# Patient Record
Sex: Male | Born: 1980 | Race: Black or African American | Hispanic: No | Smoking: Former smoker
Health system: Southern US, Community
[De-identification: ages and names within clinical notes are randomized; demographics above are authoritative.]

## PROBLEM LIST (undated history)

## (undated) ENCOUNTER — Emergency Department (HOSPITAL_COMMUNITY): Discharge status: Against Medical Advice | Payer: No Typology Code available for payment source

## (undated) DIAGNOSIS — I10 Essential (primary) hypertension: Secondary | ICD-10-CM

## (undated) DIAGNOSIS — E785 Hyperlipidemia, unspecified: Secondary | ICD-10-CM

## (undated) DIAGNOSIS — J45909 Unspecified asthma, uncomplicated: Secondary | ICD-10-CM

## (undated) DIAGNOSIS — K219 Gastro-esophageal reflux disease without esophagitis: Secondary | ICD-10-CM

## (undated) HISTORY — PX: UPPER GASTROINTESTINAL ENDOSCOPY: SHX188

## (undated) HISTORY — PX: FRACTURE SURGERY: SHX138

## (undated) SURGERY — Surgical Case
Anesthesia: *Unknown

---

## 2010-09-02 ENCOUNTER — Emergency Department (HOSPITAL_COMMUNITY)
Admission: EM | Admit: 2010-09-02 | Discharge: 2010-09-02 | Payer: Self-pay | Source: Home / Self Care | Admitting: Emergency Medicine

## 2010-12-14 ENCOUNTER — Emergency Department (HOSPITAL_COMMUNITY): Payer: Self-pay

## 2010-12-14 ENCOUNTER — Emergency Department (HOSPITAL_COMMUNITY)
Admission: EM | Admit: 2010-12-14 | Discharge: 2010-12-14 | Disposition: A | Discharge status: Home / Self Care | Payer: Self-pay | Attending: Emergency Medicine | Admitting: Emergency Medicine

## 2010-12-14 DIAGNOSIS — I10 Essential (primary) hypertension: Secondary | ICD-10-CM | POA: Insufficient documentation

## 2010-12-14 DIAGNOSIS — E785 Hyperlipidemia, unspecified: Secondary | ICD-10-CM | POA: Insufficient documentation

## 2010-12-14 DIAGNOSIS — M25519 Pain in unspecified shoulder: Secondary | ICD-10-CM | POA: Insufficient documentation

## 2010-12-14 DIAGNOSIS — IMO0001 Reserved for inherently not codable concepts without codable children: Secondary | ICD-10-CM | POA: Insufficient documentation

## 2010-12-14 DIAGNOSIS — R071 Chest pain on breathing: Secondary | ICD-10-CM | POA: Insufficient documentation

## 2011-01-21 ENCOUNTER — Emergency Department (HOSPITAL_COMMUNITY)
Admission: EM | Admit: 2011-01-21 | Discharge: 2011-01-21 | Disposition: A | Discharge status: Home / Self Care | Payer: Self-pay | Attending: Emergency Medicine | Admitting: Emergency Medicine

## 2011-01-21 DIAGNOSIS — I10 Essential (primary) hypertension: Secondary | ICD-10-CM | POA: Insufficient documentation

## 2011-01-21 DIAGNOSIS — E785 Hyperlipidemia, unspecified: Secondary | ICD-10-CM | POA: Insufficient documentation

## 2011-01-21 DIAGNOSIS — H9209 Otalgia, unspecified ear: Secondary | ICD-10-CM | POA: Insufficient documentation

## 2011-01-21 DIAGNOSIS — H6690 Otitis media, unspecified, unspecified ear: Secondary | ICD-10-CM | POA: Insufficient documentation

## 2011-04-20 ENCOUNTER — Emergency Department (HOSPITAL_COMMUNITY)
Admission: EM | Admit: 2011-04-20 | Discharge: 2011-04-21 | Disposition: A | Discharge status: Home / Self Care | Payer: Self-pay | Attending: Emergency Medicine | Admitting: Emergency Medicine

## 2011-04-20 DIAGNOSIS — IMO0002 Reserved for concepts with insufficient information to code with codable children: Secondary | ICD-10-CM | POA: Insufficient documentation

## 2011-04-20 DIAGNOSIS — H9210 Otorrhea, unspecified ear: Secondary | ICD-10-CM | POA: Insufficient documentation

## 2011-04-20 DIAGNOSIS — T169XXA Foreign body in ear, unspecified ear, initial encounter: Secondary | ICD-10-CM | POA: Insufficient documentation

## 2011-04-20 DIAGNOSIS — E785 Hyperlipidemia, unspecified: Secondary | ICD-10-CM | POA: Insufficient documentation

## 2011-04-20 DIAGNOSIS — I10 Essential (primary) hypertension: Secondary | ICD-10-CM | POA: Insufficient documentation

## 2011-09-16 ENCOUNTER — Emergency Department (HOSPITAL_COMMUNITY)
Admission: EM | Admit: 2011-09-16 | Discharge: 2011-09-16 | Disposition: A | Discharge status: Home / Self Care | Payer: Self-pay | Attending: Emergency Medicine | Admitting: Emergency Medicine

## 2011-09-16 ENCOUNTER — Encounter (HOSPITAL_COMMUNITY): Payer: Self-pay | Admitting: Emergency Medicine

## 2011-09-16 DIAGNOSIS — N63 Unspecified lump in unspecified breast: Secondary | ICD-10-CM | POA: Insufficient documentation

## 2011-09-16 DIAGNOSIS — N6320 Unspecified lump in the left breast, unspecified quadrant: Secondary | ICD-10-CM

## 2011-09-16 HISTORY — DX: Hyperlipidemia, unspecified: E78.5

## 2011-09-16 HISTORY — DX: Essential (primary) hypertension: I10

## 2011-09-16 NOTE — ED Notes (Signed)
Pt reports mass in his L breast since July.  Reports pain and swelling comes and goes.

## 2011-09-16 NOTE — ED Provider Notes (Signed)
History     CSN: [id number]  Arrival date & time 09/16/11  1650   First MD Initiated Contact with Patient 09/16/11 1815      Chief Complaint  Patient presents with  . Breast Mass    (Consider location/radiation/quality/duration/timing/severity/associated sxs/prior treatment) HPI Comments: Patient reports small mass in left breast that he first noticed last June (8 months ago).  States the mass gets bigger and smaller, sometimes is tender.  Denies fever, unexplained weight loss or gain, night sweats, or any recent illness.  Pt does not have a PCP.    The history is provided by the patient.    Past Medical History  Diagnosis Date  . Hypertension   . Hyperlipemia     Past Surgical History  Procedure Date  . Fracture surgery     No family history on file.  History  Substance Use Topics  . Smoking status: Current Everyday Smoker  . Smokeless tobacco: Not on file  . Alcohol Use: No      Review of Systems  All other systems reviewed and are negative.    Allergies  Review of patient's allergies indicates no known allergies.  Home Medications  No current outpatient prescriptions on file.  BP 156/95  Pulse 70  Temp(Src) 98.5 F (36.9 C) (Oral)  Resp 18  SpO2 99%  Physical Exam  Nursing note and vitals reviewed. Constitutional: He is oriented to person, place, and time. He appears well-developed and well-nourished.  HENT:  Head: Normocephalic and atraumatic.  Neck: Neck supple.  Pulmonary/Chest: Effort normal. Left breast exhibits mass. Left breast exhibits no nipple discharge and no skin change. Breasts are symmetrical.    Neurological: He is alert and oriented to person, place, and time.    ED Course  Procedures (including critical care time)  Labs Reviewed - No data to display No results found.   1. Mass of left breast       MDM  Patient with small mass in left breast that he found 8 months ago.  Lump changes in size and is intermittently  tender, it is mobile.  I discussed with him that these are generally good signs, but that men do develop breast cancer.  Patient verbalizes understanding and agrees he would like further evaluation.   I discussed proper follow up with Dr Roselyn Bering who recommends PCP or general surgery for follow up.  I have given resources and referral to patient, who verbalizes understanding and agrees with plan.          Dillard Cannon [place], [place] 09/17/11 [phone]

## 2011-09-16 NOTE — ED Notes (Signed)
Pt noticed a mass in left breast in June of last year. "Sometimes it gets bigger sometimes it gets smaller" Pt reports it does not hurt today and is small today but came to ED because others were expressing concerns that "I didn't get it checked out yet."

## 2011-09-19 NOTE — ED Provider Notes (Signed)
Medical screening examination/treatment/procedure(s) were performed by non-physician practitioner and as supervising physician I was immediately available for consultation/collaboration.   Page Lancon R Trindon Dorton, MD 09/19/11 0834 

## 2011-09-27 ENCOUNTER — Ambulatory Visit (INDEPENDENT_AMBULATORY_CARE_PROVIDER_SITE_OTHER): Payer: Self-pay | Admitting: General Surgery

## 2011-10-05 ENCOUNTER — Other Ambulatory Visit: Payer: Self-pay

## 2011-10-05 ENCOUNTER — Emergency Department (HOSPITAL_COMMUNITY): Payer: Self-pay

## 2011-10-05 ENCOUNTER — Encounter (HOSPITAL_COMMUNITY): Payer: Self-pay | Admitting: *Deleted

## 2011-10-05 ENCOUNTER — Emergency Department (HOSPITAL_COMMUNITY)
Admission: EM | Admit: 2011-10-05 | Discharge: 2011-10-05 | Disposition: A | Discharge status: Home / Self Care | Payer: Self-pay | Attending: Emergency Medicine | Admitting: Emergency Medicine

## 2011-10-05 ENCOUNTER — Emergency Department (INDEPENDENT_AMBULATORY_CARE_PROVIDER_SITE_OTHER)
Admission: EM | Admit: 2011-10-05 | Discharge: 2011-10-05 | Disposition: A | Discharge status: Nursing Home (Includes ICF) | Payer: Self-pay | Source: Home / Self Care | Attending: Emergency Medicine | Admitting: Emergency Medicine

## 2011-10-05 DIAGNOSIS — E785 Hyperlipidemia, unspecified: Secondary | ICD-10-CM | POA: Insufficient documentation

## 2011-10-05 DIAGNOSIS — R202 Paresthesia of skin: Secondary | ICD-10-CM

## 2011-10-05 DIAGNOSIS — R7309 Other abnormal glucose: Secondary | ICD-10-CM | POA: Insufficient documentation

## 2011-10-05 DIAGNOSIS — I10 Essential (primary) hypertension: Secondary | ICD-10-CM | POA: Insufficient documentation

## 2011-10-05 DIAGNOSIS — R739 Hyperglycemia, unspecified: Secondary | ICD-10-CM

## 2011-10-05 DIAGNOSIS — R209 Unspecified disturbances of skin sensation: Secondary | ICD-10-CM

## 2011-10-05 LAB — POCT I-STAT, CHEM 8
BUN: 13 mg/dL (ref 6–23)
Calcium, Ion: 1.26 mmol/L (ref 1.12–1.32)
Chloride: 105 mEq/L (ref 96–112)
Creatinine, Ser: 1.2 mg/dL (ref 0.50–1.35)
Glucose, Bld: 159 mg/dL — ABNORMAL HIGH (ref 70–99)

## 2011-10-05 NOTE — ED Provider Notes (Signed)
History     CSN: [id number]  Arrival date & time 10/05/11  1352   First MD Initiated Contact with Patient 10/05/11 1402      No chief complaint on file.   (Consider location/radiation/quality/duration/timing/severity/associated sxs/prior treatment) HPI Comments: For about 3-4 weeks, the Left side of my body goes numb and feel tingling" My face to my Left upper arm and left leg, sometimes with cramping pains on my leg" " At time feels like my balance its off"  Patient is a 31 y.o. male presenting with neurologic complaint. The history is provided by the patient.  Neurologic Problem The primary symptoms include dizziness, paresthesias and loss of sensation. Primary symptoms do not include headaches, loss of consciousness, seizures, visual change, focal weakness, memory loss, fever, nausea or vomiting. The symptoms are waxing and waning. The neurological symptoms are focal.  Dizziness does not occur with nausea, vomiting, weakness or diaphoresis.  Additional symptoms do not include neck stiffness, weakness, pain or photophobia. Medical issues also include hypertension. Medical issues do not include seizures or cerebral vascular accident.    Past Medical History  Diagnosis Date  . Hypertension   . Hyperlipemia     Past Surgical History  Procedure Date  . Fracture surgery     No family history on file.  History  Substance Use Topics  . Smoking status: Current Everyday Smoker  . Smokeless tobacco: Not on file  . Alcohol Use: No      Review of Systems  Constitutional: Negative for fever, diaphoresis, activity change and appetite change.  HENT: Negative for neck stiffness.   Eyes: Negative for photophobia and visual disturbance.  Respiratory: Negative for shortness of breath and stridor.   Gastrointestinal: Negative for nausea and vomiting.  Neurological: Positive for dizziness, numbness and paresthesias. Negative for focal weakness, seizures, loss of consciousness, syncope,  speech difficulty, weakness and headaches.  Psychiatric/Behavioral: Negative for memory loss.    Allergies  Review of patient's allergies indicates no known allergies.  Home Medications  No current outpatient prescriptions on file.  BP 138/83  Pulse 71  Temp(Src) 98.3 F (36.8 C) (Oral)  Resp 20  SpO2 96%  Physical Exam  Nursing note and vitals reviewed. Constitutional: He is oriented to person, place, and time. He appears well-developed and well-nourished. No distress.  HENT:  Head: Normocephalic.  Eyes: Conjunctivae are normal. No scleral icterus.  Neck: Neck supple. No JVD present.  Cardiovascular: Normal rate and regular rhythm.  Exam reveals no friction rub.   Musculoskeletal: Normal range of motion.  Neurological: He is alert and oriented to person, place, and time. He displays no atrophy and no tremor. No cranial nerve deficit or sensory deficit. He exhibits normal muscle tone. Coordination normal.  Skin: Skin is warm.    ED Course  Procedures (including critical care time)  Labs Reviewed - No data to display No results found.   1. Paresthesias/numbness   2. Paresthesia of left arm and leg       MDM  Neurological symptoms- x 3 weeks. No focal neurological signs.Transfered to the ED for further evaluation- also relates disequilibrium intermittent         Jimmie Molly, MD 10/05/11 1427

## 2011-10-05 NOTE — ED Notes (Addendum)
Patient reports he had numbness in his left side 2 weeks ago.  He had return of sx today.  Patient with no noted weakness.  He was sent from [facility] for further eval.  Patient states he woke and felt like he was drooling this morning.  He also reports intermittently he has feeling that everything is numb on the left side

## 2011-10-05 NOTE — ED Notes (Signed)
Ambulatory with no verbal complaints at this time

## 2011-10-05 NOTE — ED Provider Notes (Signed)
History     CSN: [id number]  Arrival date & time 10/05/11  1459   First MD Initiated Contact with Patient 10/05/11 2011      Chief Complaint  Patient presents with  . Numbness   HPI: The history is provided by the patient.  Pt reports that approx 2 weeks ago he had intermittent numbness to his left side that lasted most of a day then resolved. Today he awoke and felt like he was "drooling" and had intermittent numbness to (L) side through-out the day at work. He left work to come for eval as he was concerned about being at work w/ these symptoms. States today he has also had intermittent numbness to (L) side of his face as well. Pertinent negatives include no CP, SOB, weakness, H/A, slurred speech, fever, facial pain, visual disturbances, neck pain or other associated symptoms. Denies recent illnesses.  Past Medical History  Diagnosis Date  . Hypertension   . Hyperlipemia     Past Surgical History  Procedure Date  . Fracture surgery     Family History  Problem Relation Age of Onset  . Liver disease Mother   . Coronary artery disease Father     History  Substance Use Topics  . Smoking status: Current Everyday Smoker  . Smokeless tobacco: Not on file  . Alcohol Use: No      Review of Systems  Constitutional: Negative.   HENT: Negative.   Eyes: Negative.   Respiratory: Negative.   Cardiovascular: Negative.   Gastrointestinal: Negative.   Genitourinary: Negative.   Musculoskeletal: Negative.   Skin: Negative.   Neurological: Negative.   Hematological: Negative.   Psychiatric/Behavioral: Negative.     Allergies  Review of patient's allergies indicates no known allergies.  Home Medications  No current outpatient prescriptions on file.  BP 146/96  Pulse 87  Temp(Src) 98.1 F (36.7 C) (Oral)  Resp 18  SpO2 100%  Physical Exam  Constitutional: He is oriented to person, place, and time. He appears well-developed and well-nourished.  HENT:  Head:  Normocephalic and atraumatic.  Eyes: Conjunctivae and EOM are normal. Pupils are equal, round, and reactive to light.  Neck: Normal range of motion. Neck supple.  Cardiovascular: Normal rate and regular rhythm.   Pulmonary/Chest: Effort normal and breath sounds normal.  Abdominal: Soft. Bowel sounds are normal.  Musculoskeletal: Normal range of motion.  Neurological: He is alert and oriented to person, place, and time. He has normal strength and normal reflexes. No cranial nerve deficit or sensory deficit. He displays a negative Romberg sign. Coordination normal. GCS eye subscore is 4. GCS verbal subscore is 5. GCS motor subscore is 6.       No objective facial droop.  Skin: Skin is warm and dry.  Psychiatric: He has a normal mood and affect.    ED Course  Procedures Results of Ct and PE discussed. Discussed need for f/u w/ neurology if symptoms continue. Also discussed slightly elevated serum glucose and need for f/u w/ PCP for re-eval for same. Will plan for d/c home w/ neuro and PCP referrals. Pt agreeable w/ plan.  Labs Reviewed  POCT I-STAT, CHEM 8 - Abnormal; Notable for the following:    Glucose, Bld 159 (*)    All other components within normal limits   Ct Head Wo Contrast  10/05/2011  *RADIOLOGY REPORT*  Clinical Data: Left side numbness  CT HEAD WITHOUT CONTRAST  Technique:  Contiguous axial images were obtained from the base of the  skull through the vertex without contrast.  Comparison: None  Findings: Normal ventricular morphology. No midline shift or mass effect. Normal appearance of brain parenchyma. No intracranial hemorrhage, mass lesion, or acute infarction. Visualized paranasal sinuses and mastoid air cells clear. Bones unremarkable.  IMPRESSION: No acute intracranial abnormalities.  Original Report Authenticated By: Lollie Marrow, M.D.     1. Paresthesia   2. Hyperglycemia       MDM  HPI/PE and clinical findings c/w  1. Paresthesias (Ct neg, PE unremarkable, No  focal neurological findings) Acute neurological process unlikely. 2. Hyperglycemia        Leanne Chang, NP 10/07/11 [phone]

## 2011-10-05 NOTE — ED Notes (Signed)
MD at bedside. 

## 2011-10-05 NOTE — ED Notes (Signed)
Pt reports that his left arm from the shoulder to the tips of his fingers.  He denies decrease in ROM.  MAE without difficulty

## 2011-10-05 NOTE — ED Notes (Signed)
Onset 2 weeks ago pt woke up with left sided numbness--2 episodes.  He felt fine until today when the same feelings occurred. With left sided numbness. Which has resolved somewhat while being here in the exam room.  Speech clear, no facial droop or arm drift.    denies headache

## 2011-10-05 NOTE — ED Notes (Signed)
Pt given resources for financial assistance and health department.  He denies visual disturbances.  He does report polydipsia and polyuria.

## 2011-10-05 NOTE — ED Notes (Signed)
Awaiting results of diagnostic studies for disposition.

## 2011-10-06 ENCOUNTER — Encounter (INDEPENDENT_AMBULATORY_CARE_PROVIDER_SITE_OTHER): Payer: Self-pay | Admitting: General Surgery

## 2011-10-15 NOTE — ED Provider Notes (Signed)
Medical screening examination/treatment/procedure(s) were performed by non-physician practitioner and as supervising physician I was immediately available for consultation/collaboration.  Raeford Razor, MD 10/15/11 [phone]

## 2011-11-16 ENCOUNTER — Emergency Department (HOSPITAL_COMMUNITY)
Admission: EM | Admit: 2011-11-16 | Discharge: 2011-11-16 | Disposition: A | Discharge status: Home Health | Payer: Self-pay | Source: Home / Self Care | Attending: Family Medicine | Admitting: Family Medicine

## 2011-11-16 ENCOUNTER — Encounter (HOSPITAL_COMMUNITY): Payer: Self-pay

## 2011-11-16 DIAGNOSIS — Z202 Contact with and (suspected) exposure to infections with a predominantly sexual mode of transmission: Secondary | ICD-10-CM

## 2011-11-16 DIAGNOSIS — A084 Viral intestinal infection, unspecified: Secondary | ICD-10-CM

## 2011-11-16 DIAGNOSIS — A09 Infectious gastroenteritis and colitis, unspecified: Secondary | ICD-10-CM

## 2011-11-16 MED ORDER — ALIGN 4 MG PO CAPS: 4.0000 mg | ORAL_CAPSULE | Freq: Two times a day (BID) | ORAL | Status: DC

## 2011-11-16 MED ORDER — METRONIDAZOLE 250 MG PO TABS: 250.0000 mg | ORAL_TABLET | Freq: Three times a day (TID) | ORAL | Status: AC

## 2011-11-16 NOTE — Discharge Instructions (Signed)
Clear liquid today , bland diet tonight as tolerated, advance on wed as improved, use medicine as needed, return or see your doctor if any problems. take medicine for trichomonas when diarrhea improved.

## 2011-11-16 NOTE — ED Notes (Signed)
C/o vomiting and diarrhea that started on Friday. Reports last vomited on Saturday but continues to have diarrhea every time he eats food.  Also states his girlfriend was diagnosed with trichomonas on Friday.

## 2011-11-16 NOTE — ED Provider Notes (Signed)
History     CSN: [id number]  Arrival date & time 11/16/11  4098   First MD Initiated Contact with Patient 11/16/11 [phone]      Chief Complaint  Patient presents with  . Diarrhea  . Exposure to STD    (Consider location/radiation/quality/duration/timing/severity/associated sxs/prior treatment) Patient is a 31 y.o. male presenting with vomiting. The history is provided by the patient.  Emesis  This is a new problem. The current episode started more than 2 days ago. The problem has been gradually improving. The emesis has an appearance of bilious material. There has been no fever. Associated symptoms include diarrhea. Associated symptoms comments: Also girlfriend with trichomonas dx'd last wed.. Risk factors include ill contacts.    Past Medical History  Diagnosis Date  . Hypertension   . Hyperlipemia     Past Surgical History  Procedure Date  . Fracture surgery     Family History  Problem Relation Age of Onset  . Liver disease Mother   . Coronary artery disease Father     History  Substance Use Topics  . Smoking status: Current Everyday Smoker  . Smokeless tobacco: Not on file  . Alcohol Use: Yes      Review of Systems  Constitutional: Negative.   Gastrointestinal: Positive for nausea, vomiting and diarrhea.  Genitourinary: Negative.     Allergies  Review of patient's allergies indicates no known allergies.  Home Medications   Current Outpatient Rx  Name Route Sig Dispense Refill  . METRONIDAZOLE 250 MG PO TABS Oral Take 1 tablet (250 mg total) by mouth 3 (three) times daily. 21 tablet 0  . ALIGN 4 MG PO CAPS Oral Take 4 mg by mouth 2 (two) times daily. 30 capsule 1    BP 158/94  Pulse 80  Temp(Src) 98.2 F (36.8 C) (Oral)  Resp 18  SpO2 99%  Physical Exam  Nursing note and vitals reviewed. Constitutional: He is oriented to person, place, and time. He appears well-developed and well-nourished.  HENT:  Mouth/Throat: Oropharynx is clear and moist.    Neck: Normal range of motion. Neck supple.  Abdominal: Soft. Bowel sounds are normal. He exhibits no distension. There is no tenderness. There is no rebound.  Genitourinary: Penis normal.  Neurological: He is alert and oriented to person, place, and time.    ED Course  Procedures (including critical care time)  Labs Reviewed - No data to display No results found.   1. Gastroenteritis and colitis, viral   2. Exposure to STD       MDM          Linna Hoff, MD 11/20/11 [phone]

## 2012-01-11 ENCOUNTER — Emergency Department (INDEPENDENT_AMBULATORY_CARE_PROVIDER_SITE_OTHER)
Admission: EM | Admit: 2012-01-11 | Discharge: 2012-01-11 | Disposition: A | Discharge status: Home / Self Care | Payer: Self-pay | Source: Home / Self Care | Attending: Family Medicine | Admitting: Family Medicine

## 2012-01-11 ENCOUNTER — Encounter (HOSPITAL_COMMUNITY): Payer: Self-pay

## 2012-01-11 DIAGNOSIS — Z760 Encounter for issue of repeat prescription: Secondary | ICD-10-CM

## 2012-01-11 DIAGNOSIS — I10 Essential (primary) hypertension: Secondary | ICD-10-CM

## 2012-01-11 MED ORDER — HYDROCHLOROTHIAZIDE 25 MG PO TABS
25.0000 mg | ORAL_TABLET | Freq: Every day | ORAL | Status: DC
Start: 2012-01-11 — End: 2013-06-10

## 2012-01-11 NOTE — Discharge Instructions (Signed)
Read the information below.  Please take the medication as prescribed and recheck your blood pressure within one week.  Use the resources below to find a primary care provider for follow up.  You may return to the urgent care at any time for worsening condition or any new symptoms that concern you.   Hypertension Information As your heart beats, it forces blood through your arteries. This force is your blood pressure. If the pressure is too high, it is called hypertension (HTN) or high blood pressure. HTN is dangerous because you may have it and not know it. High blood pressure may mean that your heart has to work harder to pump blood. Your arteries may be narrow or stiff. The extra work puts you at risk for heart disease, stroke, and other problems.  Blood pressure consists of two numbers, a higher number over a lower, 110/72, for example. It is stated as "110 over 72." The ideal is below 120 for the top number (systolic) and under 80 for the bottom (diastolic).  You should pay close attention to your blood pressure if you have certain conditions such as:  Heart failure.   Prior heart attack.   Diabetes   Chronic kidney disease.   Prior stroke.   Multiple risk factors for heart disease.  To see if you have HTN, your blood pressure should be measured while you are seated with your arm held at the level of the heart. It should be measured at least twice. A one-time elevated blood pressure reading (especially in the Emergency Department) does not mean that you need treatment. There may be conditions in which the blood pressure is different between your right and left arms. It is important to see your caregiver soon for a recheck. Most people have essential hypertension which means that there is not a specific cause. This type of high blood pressure may be lowered by changing lifestyle factors such as:  Stress.   Smoking.   Lack of exercise.   Excessive weight.   Drug/tobacco/alcohol use.     Eating less salt.  Most people do not have symptoms from high blood pressure until it has caused damage to the body. Effective treatment can often prevent, delay or reduce that damage. TREATMENT  Treatment for high blood pressure, when a cause has been identified, is directed at the cause. There are a large number of medications to treat HTN. These fall into several categories, and your caregiver will help you select the medicines that are best for you. Medications may have side effects. You should review side effects with your caregiver. If your blood pressure stays high after you have made lifestyle changes or started on medicines,   Your medication(s) may need to be changed.   Other problems may need to be addressed.   Be certain you understand your prescriptions, and know how and when to take your medicine.   Be sure to follow up with your caregiver within the time frame advised (usually within two weeks) to have your blood pressure rechecked and to review your medications.   If you are taking more than one medicine to lower your blood pressure, make sure you know how and at what times they should be taken. Taking two medicines at the same time can result in blood pressure that is too low.  Document Released: 10/26/2005 Document Revised: 05/05/2011 Document Reviewed: 11/02/2007 [facility] Patient Information 2012 [place], [place].  If you have no primary doctor, here are some resources that may be  helpful:  Medicaid-accepting [facility] Providers:   - [facility]- [address]      [phone]      Mon-Fri 9am-7pm, Sat 9am-1pm   - [facility]- [address] [place]      [phone]   - [facility]- [address], [address] [place]      [phone]   [facility]- [address]      [phone]   - Renaye Rakers- [address] [place], [address]      [phone]      Only accepts [place] patients       after they have her name applied to their card   Self Pay (no insurance) in [place]:   - Sickle Cell Patients: Dr Willey Blade, [facility]      [address] [place]      [phone]   - [phone]   - Physician Referral Service- [phone]   - [facility]- [address] [place]      [phone]   [facility]- [address]   - [facility]- see information above      (Speak to [employer] if you do not have insurance)   - [facility]- [address] [place]      [phone]   - [facility] [place]- [address]      [phone]   - [facility]- [address]      [phone]   - Dr Julio Sicks-  [address], [place]      [phone]   - [facility]- [address]      [phone]   - [facility]- [address]      [phone]      Also [address]      [phone]   - [facility]- [address]      [phone]      1st and 3rd Saturday every month, 10am-1pm Other agencies that provide inexpensive medical care:    [facility]  [phone]    [facility]  [phone]    [facility]  [phone]    [facility]  [phone]    [facility]  [phone]  General Information: Finding a doctor when you do not have health insurance can be tricky. Although you are not limited by an insurance plan, you are of course limited by her finances and how much but he can pay out of pocket.  What are your options if you don't have health insurance?   1) Find a Librarian, academic and Pay Out of Pocket Although you won't have to find out who is covered by your insurance plan, it is a good idea to ask around and get recommendations. You will then need to call the office and see if the doctor you have chosen will accept you as a new patient and what types of options they offer for  patients who are self-pay. Some doctors offer discounts or will set up payment plans for their patients who do not have insurance, but you will need to ask so you aren't surprised when you get to your appointment.  2) Contact Your Local Health Department Not all health departments have  doctors that can see patients for sick visits, but many do, so it is worth a call to see if yours does. If you don't know where your local health department is, you can check in your phone book. The CDC also has a tool to help you locate your state's health department, and many state websites also have listings of all of their local health departments.  3) Find a Walk-in Clinic If your illness is not likely to be very severe or complicated, you may want to try a walk in clinic. These are popping up all over the country in pharmacies, drugstores, and shopping centers. They're usually staffed by nurse practitioners or physician assistants that have been trained to treat common illnesses and complaints. They're usually fairly quick and inexpensive. However, if you have serious medical issues or chronic medical problems, these are probably not your best option  RESOURCE GUIDE  Dental Problems  Patients with Medicaid: [facility] [phone] [address]                                           [phone] [address] [employer] [address] Phone:  [phone]                                                  Phone:  [phone]  If unable to pay or uninsured, contact:  [facility] or [facility]. to become qualified for the adult dental clinic.  Chronic Pain Problems Contact [facility]  [phone] Patients need to be referred by their primary care doctor.  Insufficient Money for Medicine Contact United Way:  call "211" or [facility] [phone].  No Primary Care Doctor Call [facility]  [phone] Other agencies that provide inexpensive medical care     [facility]  [phone]    [facility]  [phone]    [facility]  [phone]    [facility]  [phone]    [facility]  [phone]    [facility]  [phone]  Psychological Services [facility]  [phone] [facility]  [phone] [facility]   [phone] (emergency services [phone])  Substance Abuse Resources [facility]  [phone] [facility] [phone] [facility] [place] [phone] Floydene Flock [phone] Residential & Outpatient Substance Abuse Program  [phone]  Abuse/Neglect [facility] Child Abuse Hotline 918-831-8858 [facility] Child Abuse Hotline [phone] (After Hours)  Emergency Shelter [facility] [phone]  Maternity Homes Room at the [place] [facility] [place] [phone] [facility] Services [phone]  MRSA Hotline #:   709 526 1906    [facility] Resources  [facility] [place]     United Way                          [facility] Dept. [address] [place]                       [address]  [place] Phone:  [phone]                                   Phone:  [phone]                 Phone:  [place] Phone:  [place] [phone] (After Hours)

## 2012-01-11 NOTE — ED Provider Notes (Signed)
History     CSN: [id number]  Arrival date & time 01/11/12  8469   First MD Initiated Contact with Patient 01/11/12 0957      Chief Complaint  Patient presents with  . Medication Refill  . Hypertension    (Consider location/radiation/quality/duration/timing/severity/associated sxs/prior treatment) HPI Comments: Patient reports he was on something for blood pressure for many years.  When he got out of prison, his prescription ran out and he did not have a PCP to follow up with.  Recently he was required to get a physical exam for a new living situation he is in and his bp was 159/115.  States he has checked it several other times and it has been high.   He was also previously on medication for cholesterol.  He has been off his medications since 2010.  Denies CP, SOB, HA.  Is unsure of his previous blood pressure medication - states he knows is was a diuretic and it was 25mg .  When asked is possibly HCTZ, he confirms that this was what he was on.    The history is provided by the patient.    Past Medical History  Diagnosis Date  . Hypertension   . Hyperlipemia     Past Surgical History  Procedure Date  . Fracture surgery     Family History  Problem Relation Age of Onset  . Liver disease Mother   . Coronary artery disease Father     History  Substance Use Topics  . Smoking status: Current Everyday Smoker  . Smokeless tobacco: Not on file  . Alcohol Use: No      Review of Systems  Respiratory: Negative for shortness of breath.   Cardiovascular: Negative for chest pain.  Neurological: Negative for syncope and headaches.    Allergies  Review of patient's allergies indicates no known allergies.  Home Medications   Current Outpatient Rx  Name Route Sig Dispense Refill  . HYDROCHLOROTHIAZIDE 25 MG PO TABS Oral Take 1 tablet (25 mg total) by mouth daily. 30 tablet 1  . ALIGN 4 MG PO CAPS Oral Take 4 mg by mouth 2 (two) times daily. 30 capsule 1    BP 152/95  Pulse  71  Temp(Src) 97.7 F (36.5 C) (Oral)  Resp 16  SpO2 99%  Physical Exam  Nursing note and vitals reviewed. Constitutional: He is oriented to person, place, and time. He appears well-developed and well-nourished.  HENT:  Head: Normocephalic and atraumatic.  Neck: Neck supple.  Cardiovascular: Normal rate and regular rhythm.   Pulmonary/Chest: Effort normal and breath sounds normal. No respiratory distress. He has no wheezes. He has no rales.  Neurological: He is alert and oriented to person, place, and time. He exhibits normal muscle tone.  Psychiatric: He has a normal mood and affect. His behavior is normal. Judgment and thought content normal.    ED Course  Procedures (including critical care time)  Labs Reviewed - No data to display No results found.   Filed Vitals:   01/11/12 1054  BP: 152/95  Pulse: 71  Temp: 97.7 F (36.5 C)  Resp: 16     1. HTN (hypertension)   2. Medication refill       MDM  Patient with hx HTN, off his meds for 2 years, found to be hypertensive again.  BP here is 152/95.  Pt thinks he was previously on HCTZ.  I have restarted the HCTZ, have asked that he have it rechecked in a week.  Pt  will also likely need hyperlipidemia agent, but as pt has not had cholesterol checked in over two years and has not been on it recently, I will not start that today.  I have given him resources for follow up with PCP.  Restarted HCTZ 25mg .  Given patient's elevated BP, he may eventually need additional agent. Patient verbalizes understanding and agrees with plan.          Dillard Cannon [place], [place] 01/11/12 1237

## 2012-01-11 NOTE — ED Notes (Signed)
Pt states he was incarcerated from 2002-2010, has not had his BP and cholesterol meds since being released.  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 Sent him for a physical on Friday and his BP was 159/115, so they sent him here to see if he could get rx for medications. Pt unsure of medications he was on.

## 2012-01-12 NOTE — ED Provider Notes (Signed)
Medical screening examination/treatment/procedure(s) were performed by resident physician or non-physician practitioner and as supervising physician I was immediately available for consultation/collaboration.   Barkley Bruns MD.    Linna Hoff, MD 01/12/12 2059

## 2012-01-22 ENCOUNTER — Encounter (HOSPITAL_COMMUNITY): Payer: Self-pay | Admitting: Emergency Medicine

## 2012-01-22 ENCOUNTER — Emergency Department (HOSPITAL_COMMUNITY)
Admission: EM | Admit: 2012-01-22 | Discharge: 2012-01-22 | Disposition: A | Discharge status: Home / Self Care | Payer: Self-pay | Source: Home / Self Care | Attending: Emergency Medicine | Admitting: Emergency Medicine

## 2012-01-22 ENCOUNTER — Emergency Department (INDEPENDENT_AMBULATORY_CARE_PROVIDER_SITE_OTHER): Payer: Self-pay

## 2012-01-22 DIAGNOSIS — H60399 Other infective otitis externa, unspecified ear: Secondary | ICD-10-CM

## 2012-01-22 DIAGNOSIS — H60391 Other infective otitis externa, right ear: Secondary | ICD-10-CM

## 2012-01-22 MED ORDER — LIDOCAINE HCL (PF) 1 % IJ SOLN
INTRAMUSCULAR | Status: AC
  Filled 2012-01-22: qty 5

## 2012-01-22 MED ORDER — HYDROCODONE-ACETAMINOPHEN 5-325 MG PO TABS
ORAL_TABLET | ORAL | Status: AC
  Filled 2012-01-22: qty 2

## 2012-01-22 MED ORDER — LEVOFLOXACIN 500 MG PO TABS: 500.0000 mg | ORAL_TABLET | Freq: Every day | ORAL | Status: AC

## 2012-01-22 MED ORDER — HYDROCODONE-ACETAMINOPHEN 5-325 MG PO TABS
2.0000 | ORAL_TABLET | Freq: Once | ORAL | Status: AC
  Administered 2012-01-22: 2 via ORAL

## 2012-01-22 MED ORDER — IBUPROFEN 800 MG PO TABS: 800.0000 mg | ORAL_TABLET | Freq: Three times a day (TID) | ORAL | Status: AC

## 2012-01-22 MED ORDER — CEFTRIAXONE SODIUM 1 G IJ SOLR
INTRAMUSCULAR | Status: AC
  Filled 2012-01-22: qty 10

## 2012-01-22 MED ORDER — CEFTRIAXONE SODIUM 1 G IJ SOLR
1.0000 g | Freq: Once | INTRAMUSCULAR | Status: AC
  Administered 2012-01-22: 1 g via INTRAMUSCULAR

## 2012-01-22 NOTE — ED Notes (Signed)
Onset Wednesday of ear pain, right ear pain, swelling.  Reports left ear drainage.  Denies cough, cold, runny nose

## 2012-01-22 NOTE — Discharge Instructions (Signed)
Need to start with antibiotics today. Should notice a significant improvement anywhere from the second to third day. Return if no improvement, and if worsening symptoms or fevers did go to the emergency department for more aggressive treatment

## 2012-01-22 NOTE — ED Provider Notes (Signed)
History     CSN: [id number]  Arrival date & time 01/22/12  0907   First MD Initiated Contact with Patient 01/22/12 [phone]      Chief Complaint  Patient presents with  . Otalgia    (Consider location/radiation/quality/duration/timing/severity/associated sxs/prior treatment) Patient is a 31 y.o. male presenting with ear pain. The history is provided by the patient.  Otalgia This is a new problem. The current episode started yesterday. There is pain in the right ear. The problem occurs constantly. The problem has not changed since onset.There has been no fever. Pertinent negatives include no ear discharge, no headaches, no hearing loss, no rhinorrhea, no sore throat, no vomiting and no rash. His past medical history does not include chronic ear infection, hearing loss or tympanostomy tube.    Past Medical History  Diagnosis Date  . Hypertension   . Hyperlipemia     Past Surgical History  Procedure Date  . Fracture surgery     Family History  Problem Relation Age of Onset  . Liver disease Mother   . Coronary artery disease Father     History  Substance Use Topics  . Smoking status: Current Everyday Smoker  . Smokeless tobacco: Not on file  . Alcohol Use: No      Review of Systems  Constitutional: Negative for fever, chills and appetite change.  HENT: Positive for ear pain. Negative for hearing loss, congestion, sore throat, rhinorrhea and ear discharge.   Gastrointestinal: Negative for vomiting.  Skin: Negative for rash.  Neurological: Negative for dizziness and headaches.    Allergies  Review of patient's allergies indicates no known allergies.  Home Medications   Current Outpatient Rx  Name Route Sig Dispense Refill  . ASPIRIN 81 MG PO TABS Oral Take 81 mg by mouth daily.    Marland Kitchen HYDROCHLOROTHIAZIDE 25 MG PO TABS Oral Take 1 tablet (25 mg total) by mouth daily. 30 tablet 1  . ALIGN 4 MG PO CAPS Oral Take 4 mg by mouth 2 (two) times daily. 30 capsule 1    BP  145/91  Pulse 91  Temp(Src) 98.7 F (37.1 C) (Oral)  Resp 18  SpO2 95%  Physical Exam  Nursing note and vitals reviewed. Constitutional: He appears well-developed and well-nourished.  HENT:  Head: Normocephalic.    Right Ear: No lacerations. There is drainage and swelling. No foreign bodies. There is mastoid tenderness. Tympanic membrane is not injected, not perforated, not erythematous, not retracted and not bulging. Tympanic membrane mobility is normal. No hemotympanum.  Ears:    ED Course  Procedures (including critical care time)   Labs Reviewed  CULTURE, ROUTINE-ABSCESS   No results found.   No diagnosis found.    MDM  4 days of external ear canal infection, afebrile with a tender muscle with region and preauricular region.        Jimmie Molly, MD 01/22/12 1005

## 2012-01-25 ENCOUNTER — Emergency Department (HOSPITAL_COMMUNITY)
Admission: EM | Admit: 2012-01-25 | Discharge: 2012-01-25 | Disposition: A | Discharge status: Home / Self Care | Payer: Self-pay | Source: Home / Self Care | Attending: Emergency Medicine | Admitting: Emergency Medicine

## 2012-01-25 ENCOUNTER — Encounter (HOSPITAL_COMMUNITY): Payer: Self-pay | Admitting: Cardiology

## 2012-01-25 DIAGNOSIS — H60391 Other infective otitis externa, right ear: Secondary | ICD-10-CM

## 2012-01-25 LAB — CULTURE, ROUTINE-ABSCESS

## 2012-01-25 MED ORDER — CIPROFLOXACIN HCL 500 MG PO TABS: 500.0000 mg | ORAL_TABLET | Freq: Two times a day (BID) | ORAL | Status: AC

## 2012-01-25 NOTE — ED Notes (Addendum)
Abscess culture R ear canal: Abundant Pseudomonas Aeruginosa, and abundant Streptococcus Group C. Pt. adequately treated with Cipro. Derek Ingram 01/25/2012

## 2012-01-25 NOTE — ED Provider Notes (Signed)
History     CSN: [id number]  Arrival date & time 01/25/12  4098   First MD Initiated Contact with Patient 01/25/12 [phone]      Chief Complaint  Patient presents with  . Otalgia    (Consider location/radiation/quality/duration/timing/severity/associated sxs/prior treatment) HPI Comments:  "Prescription cost 100 and something dollars I couldn't afford it" " right ear still oozing discharge and is hurting", no fevers, no hearing loss. No headache.   Right-sided external ear canal infection culture positive for Pseudomonas, patient return as he was unable to refill his Levaquin prescription today sensitivity results showed multiple other options we have prescribed Cipro. Course of 10 days. Patient continues to be afebrile somewhat clinically the same with active exudates out of his right ear and mild erythema and tenderness.     Patient is a 31 y.o. male presenting with ear pain. The history is provided by the patient.  Otalgia This is a new problem. The current episode started more than 1 week ago. There is pain in the right ear. The problem occurs constantly. The problem has not changed since onset.The maximum temperature recorded prior to his arrival was 102 to 102.9 F. Associated symptoms include ear discharge. Pertinent negatives include no headaches, no hearing loss, no rhinorrhea, no sore throat, no vomiting, no cough and no rash.    Past Medical History  Diagnosis Date  . Hypertension   . Hyperlipemia     Past Surgical History  Procedure Date  . Fracture surgery     left hand    Family History  Problem Relation Age of Onset  . Liver disease Mother   . Coronary artery disease Father     History  Substance Use Topics  . Smoking status: Current Some Day Smoker    Types: Cigars  . Smokeless tobacco: Not on file  . Alcohol Use: No      Review of Systems  Constitutional: Negative for fever, chills, activity change, appetite change and fatigue.  HENT: Positive  for ear pain and ear discharge. Negative for hearing loss, congestion, sore throat, rhinorrhea and tinnitus.   Respiratory: Negative for cough.   Gastrointestinal: Negative for vomiting.  Skin: Negative for rash.  Neurological: Negative for headaches.    Allergies  Review of patient's allergies indicates no known allergies.  Home Medications   Current Outpatient Rx  Name Route Sig Dispense Refill  . ASPIRIN 81 MG PO TABS Oral Take 81 mg by mouth daily.    Marland Kitchen HYDROCHLOROTHIAZIDE 25 MG PO TABS Oral Take 1 tablet (25 mg total) by mouth daily. 30 tablet 1  . IBUPROFEN 800 MG PO TABS Oral Take 1 tablet (800 mg total) by mouth 3 (three) times daily. 21 tablet 0  . CIPROFLOXACIN HCL 500 MG PO TABS Oral Take 1 tablet (500 mg total) by mouth 2 (two) times daily. 20 tablet 0  . LEVOFLOXACIN 500 MG PO TABS Oral Take 1 tablet (500 mg total) by mouth daily. 10 tablet 0  . ALIGN 4 MG PO CAPS Oral Take 4 mg by mouth 2 (two) times daily. 30 capsule 1    BP 137/87  Pulse 83  Temp(Src) 98 F (36.7 C) (Oral)  Resp 18  Physical Exam  Nursing note and vitals reviewed. Constitutional: He appears well-developed. No distress.  HENT:  Head: Normocephalic.  Right Ear: Tympanic membrane normal. No lacerations. There is drainage and swelling. No foreign bodies. Tympanic membrane is not perforated.  Left Ear: Tympanic membrane normal. There is swelling and  tenderness. No drainage. Tympanic membrane is not perforated. No decreased hearing is noted.  Eyes: Right eye exhibits no discharge. Left eye exhibits no discharge.    ED Course  Procedures (including critical care time)  Labs Reviewed - No data to display No results found.   1. Bacterial external ear infection, right       MDM  Right-sided external ear canal infection culture positive for Pseudomonas, patient return as he was unable to refill his Levaquin prescription today sensitivity results showed multiple other options we have prescribed  Cipro. Course of 10 days. Patient continues to be afebrile somewhat clinically the same with active exudates out of his right ear and mild erythema and tenderness.        Jimmie Molly, MD 01/25/12 [phone]

## 2012-01-25 NOTE — ED Notes (Signed)
Pt reports continued right ear pain down into jaw line with swelling. Was seen this past Saturday for the same thing. No improvement. Now left ear is draining greenish/yellow. Denies fever but has not check his temp at home. He has been taking motrin as prescribed. Tolerating Po liquids but unable to eat due to jaw pain.

## 2012-02-11 ENCOUNTER — Emergency Department (HOSPITAL_COMMUNITY)
Admission: EM | Admit: 2012-02-11 | Discharge: 2012-02-11 | Disposition: A | Discharge status: Home / Self Care | Payer: Self-pay | Attending: Emergency Medicine | Admitting: Emergency Medicine

## 2012-02-11 ENCOUNTER — Encounter (HOSPITAL_COMMUNITY): Payer: Self-pay | Admitting: Emergency Medicine

## 2012-02-11 DIAGNOSIS — Z7982 Long term (current) use of aspirin: Secondary | ICD-10-CM | POA: Insufficient documentation

## 2012-02-11 DIAGNOSIS — H6090 Unspecified otitis externa, unspecified ear: Secondary | ICD-10-CM

## 2012-02-11 DIAGNOSIS — E785 Hyperlipidemia, unspecified: Secondary | ICD-10-CM | POA: Insufficient documentation

## 2012-02-11 DIAGNOSIS — L03211 Cellulitis of face: Secondary | ICD-10-CM | POA: Insufficient documentation

## 2012-02-11 DIAGNOSIS — H60399 Other infective otitis externa, unspecified ear: Secondary | ICD-10-CM | POA: Insufficient documentation

## 2012-02-11 DIAGNOSIS — L0291 Cutaneous abscess, unspecified: Secondary | ICD-10-CM

## 2012-02-11 DIAGNOSIS — L0201 Cutaneous abscess of face: Secondary | ICD-10-CM | POA: Insufficient documentation

## 2012-02-11 DIAGNOSIS — I10 Essential (primary) hypertension: Secondary | ICD-10-CM | POA: Insufficient documentation

## 2012-02-11 DIAGNOSIS — Z79899 Other long term (current) drug therapy: Secondary | ICD-10-CM | POA: Insufficient documentation

## 2012-02-11 DIAGNOSIS — F17200 Nicotine dependence, unspecified, uncomplicated: Secondary | ICD-10-CM | POA: Insufficient documentation

## 2012-02-11 LAB — DIFFERENTIAL
Basophils Absolute: 0 10*3/uL (ref 0.0–0.1)
Basophils Relative: 0 % (ref 0–1)
Eosinophils Relative: 3 % (ref 0–5)
Monocytes Absolute: 0.9 10*3/uL (ref 0.1–1.0)
Monocytes Relative: 10 % (ref 3–12)
Neutro Abs: 5.1 10*3/uL (ref 1.7–7.7)

## 2012-02-11 LAB — HEMOGLOBIN A1C: Mean Plasma Glucose: 134 mg/dL — ABNORMAL HIGH (ref <117)

## 2012-02-11 LAB — CBC
HCT: 42.7 % (ref 39.0–52.0)
Hemoglobin: 14.4 g/dL (ref 13.0–17.0)
MCHC: 33.7 g/dL (ref 30.0–36.0)
MCV: 88.2 fL (ref 78.0–100.0)
RDW: 13 % (ref 11.5–15.5)

## 2012-02-11 MED ORDER — HYDROMORPHONE HCL PF 1 MG/ML IJ SOLN
0.5000 mg | Freq: Once | INTRAMUSCULAR | Status: AC
  Administered 2012-02-11: 0.5 mg via INTRAVENOUS

## 2012-02-11 MED ORDER — HYDROCODONE-ACETAMINOPHEN 5-325 MG PO TABS
1.0000 | ORAL_TABLET | Freq: Once | ORAL | Status: AC
  Administered 2012-02-11: 1 via ORAL
  Filled 2012-02-11: qty 1

## 2012-02-11 MED ORDER — OFLOXACIN 0.3 % OT SOLN: 5.0000 [drp] | Freq: Two times a day (BID) | OTIC | Status: AC

## 2012-02-11 MED ORDER — CLINDAMYCIN PHOSPHATE 600 MG/50ML IV SOLN
600.0000 mg | Freq: Once | INTRAVENOUS | Status: AC
  Administered 2012-02-11: 600 mg via INTRAVENOUS
  Filled 2012-02-11: qty 50

## 2012-02-11 MED ORDER — NEOMYCIN-POLYMYXIN-HC 3.5-10000-1 OT SUSP
3.0000 [drp] | Freq: Once | OTIC | Status: AC
  Administered 2012-02-11: 3 [drp] via OTIC
  Filled 2012-02-11: qty 10

## 2012-02-11 MED ORDER — LIDOCAINE-EPINEPHRINE-TETRACAINE (LET) SOLUTION
3.0000 mL | Freq: Once | NASAL | Status: DC
  Filled 2012-02-11 (×2): qty 3

## 2012-02-11 MED ORDER — HYDROMORPHONE HCL PF 1 MG/ML IJ SOLN
0.5000 mg | Freq: Once | INTRAMUSCULAR | Status: AC
  Administered 2012-02-11: 0.5 mg via INTRAVENOUS
  Filled 2012-02-11: qty 1

## 2012-02-11 MED ORDER — CLINDAMYCIN HCL 150 MG PO CAPS: 150.0000 mg | ORAL_CAPSULE | Freq: Four times a day (QID) | ORAL | Status: AC

## 2012-02-11 MED ORDER — SODIUM CHLORIDE 0.9 % IV SOLN
Freq: Once | INTRAVENOUS | Status: AC
  Administered 2012-02-11: 10 mL/h via INTRAVENOUS

## 2012-02-11 MED ORDER — HYDROCODONE-ACETAMINOPHEN 5-500 MG PO TABS: 1.0000 | ORAL_TABLET | Freq: Four times a day (QID) | ORAL | Status: AC | PRN

## 2012-02-11 MED ORDER — CLINDAMYCIN HCL 150 MG PO CAPS: 150.0000 mg | ORAL_CAPSULE | Freq: Four times a day (QID) | ORAL | Status: DC

## 2012-02-11 MED ORDER — LIDOCAINE-EPINEPHRINE 1 %-1:100000 IJ SOLN
10.0000 mL | Freq: Once | INTRAMUSCULAR | Status: DC
  Filled 2012-02-11: qty 1

## 2012-02-11 MED ORDER — CLINDAMYCIN PHOSPHATE 900 MG/50ML IV SOLN
900.0000 mg | Freq: Once | INTRAVENOUS | Status: AC
  Administered 2012-02-11: 900 mg via INTRAVENOUS
  Filled 2012-02-11: qty 50

## 2012-02-11 NOTE — ED Notes (Signed)
Ear wick placed by NP to facilitate drops entering canal.

## 2012-02-11 NOTE — ED Notes (Signed)
RES at bedside performing I&D, all supplies at bedside

## 2012-02-11 NOTE — ED Notes (Signed)
Pt c/o bil ear infections for approx 3 weeks was seen at Urgent Care and given Rx for antibiotic.  St's he has finished antibiotics but continues to have pain.  Also c/o abcess on right side of face x's 2 days

## 2012-02-11 NOTE — ED Notes (Signed)
Patient is resting comfortably. 

## 2012-02-11 NOTE — ED Provider Notes (Signed)
History     CSN: [id number]  Arrival date & time 02/11/12  0109   First MD Initiated Contact with Patient 02/11/12 0206      Chief Complaint  Patient presents with  . Otalgia    (Consider location/radiation/quality/duration/timing/severity/associated sxs/prior treatment) HPI Comments: Patient presents to the emergency, 3, weeks of bilateral ear pain, right worse than left, stating that his right ear canal is closed.  He also has an abscess of his right cheek.  He was seen at urgent care 3, weeks ago, given a shot of Rocephin and an unknown oral antibiotic that he states he completed, without resolution of his symptoms.  He, states he can't sleep because he can't rest on either side due to the pain in his ears, and his face as throbbing.  Has taken over-the-counter ibuprofen, without relief  Patient is a 31 y.o. male presenting with ear pain. The history is provided by the patient.  Otalgia This is a new problem. The current episode started more than 1 week ago. There is pain in both ears. The problem occurs constantly. The problem has not changed since onset.There has been no fever. The pain is at a severity of 4/10. The pain is moderate. Associated symptoms include sore throat. Pertinent negatives include no ear discharge, no headaches, no hearing loss and no rhinorrhea.    Past Medical History  Diagnosis Date  . Hypertension   . Hyperlipemia     Past Surgical History  Procedure Date  . Fracture surgery     left hand    Family History  Problem Relation Age of Onset  . Liver disease Mother   . Coronary artery disease Father     History  Substance Use Topics  . Smoking status: Current Some Day Smoker    Types: Cigars  . Smokeless tobacco: Not on file  . Alcohol Use: No      Review of Systems  Constitutional: Negative for fever and chills.  HENT: Positive for ear pain and sore throat. Negative for hearing loss, rhinorrhea and ear discharge.   Eyes: Negative for  visual disturbance.  Skin: Positive for wound.  Neurological: Negative for headaches.    Allergies  Review of patient's allergies indicates no known allergies.  Home Medications   Current Outpatient Rx  Name Route Sig Dispense Refill  . ASPIRIN 81 MG PO TABS Oral Take 81 mg by mouth daily.    Marland Kitchen HYDROCHLOROTHIAZIDE 25 MG PO TABS Oral Take 1 tablet (25 mg total) by mouth daily. 30 tablet 1  . CLINDAMYCIN HCL 150 MG PO CAPS Oral Take 1 capsule (150 mg total) by mouth every 6 (six) hours. 28 capsule 0  . HYDROCODONE-ACETAMINOPHEN 5-500 MG PO TABS Oral Take 1-2 tablets by mouth every 6 (six) hours as needed for pain. 15 tablet 0    BP 132/73  Pulse 87  Temp(Src) 98.4 F (36.9 C) (Oral)  SpO2 100%  Physical Exam  Constitutional: He appears well-developed.  HENT:  Head: Normocephalic.    Right Ear: There is drainage, swelling and tenderness.  Left Ear: There is swelling and tenderness. No drainage.       Uric placed in right ear to allow medication to drain into the entire canal  Eyes:       Not concerned for a very orbital abscess  Cardiovascular: Normal rate.   Pulmonary/Chest: Effort normal.  Genitourinary: Penis normal.  Musculoskeletal: Normal range of motion.  Neurological: He is alert.  Skin:  Right cheek abscess a number of small lesions, just under the  fold of his right near    ED Course  Procedures (including critical care time)  Labs Reviewed - No data to display No results found.   1. Facial abscess   2. Otitis externa       MDM   Facial abscess bilateral otitis externa        Arman Filter, NP 02/11/12 [phone]

## 2012-02-11 NOTE — Discharge Instructions (Signed)
Abscess Care After An abscess (also called a boil or furuncle) is an infected area that contains a collection of pus. Signs and symptoms of an abscess include pain, tenderness, redness, or hardness, or you may feel a moveable soft area under your skin. An abscess can occur anywhere in the body. The infection may spread to surrounding tissues causing cellulitis. A cut (incision) by the surgeon was made over your abscess and the pus was drained out. Gauze may have been packed into the space to provide a drain that will allow the cavity to heal from the inside outwards. The boil may be painful for 5 to 7 days. Most people with a boil do not have high fevers. Your abscess, if seen early, may not have localized, and may not have been lanced. If not, another appointment may be required for this if it does not get better on its own or with medications. HOME CARE INSTRUCTIONS   Only take over-the-counter or prescription medicines for pain, discomfort, or fever as directed by your caregiver.   When you bathe, soak and then remove gauze or iodoform packs at least daily or as directed by your caregiver. You may then wash the wound gently with mild soapy water. Repack with gauze or do as your caregiver directs.  SEEK IMMEDIATE MEDICAL CARE IF:   You develop increased pain, swelling, redness, drainage, or bleeding in the wound site.   You develop signs of generalized infection including muscle aches, chills, fever, or a general ill feeling.   An oral temperature above 102 F (38.9 C) develops, not controlled by medication.  See your caregiver for a recheck if you develop any of the symptoms described above. If medications (antibiotics) were prescribed, take them as directed. Document Released: 03/11/2005 Document Revised: 08/12/2011 Document Reviewed: 11/06/2007 [facility] Patient Information 2012 [place], [place].Cellulitis Cellulitis is an infection of the tissue under the skin. The infected area is usually red  and tender. This is caused by germs. These germs enter the body through cuts or sores. This usually happens in the arms or lower legs. HOME CARE   Take your medicine as told. Finish it even if you start to feel better.   If the infection is on the arm or leg, keep it raised (elevated).   Use a warm cloth on the infected area several times a day.   See your doctor for a follow-up visit as told.  GET HELP RIGHT AWAY IF:   You are tired or confused.   You throw up (vomit).   You have watery poop (diarrhea).   You feel ill and have muscle aches.   You have a fever.  MAKE SURE YOU:   Understand these instructions.   Will watch your condition.   Will get help right away if you are not doing well or get worse.  Document Released: 02/09/2008 Document Revised: 08/12/2011 Document Reviewed: 07/25/2009 [facility] Patient Information 2012 [place], [place].Cellulitis Cellulitis is an infection of the tissue under the skin. The infected area is usually red and tender. This is caused by germs. These germs enter the body through cuts or sores. This usually happens in the arms or lower legs. HOME CARE   Take your medicine as told. Finish it even if you start to feel better.   If the infection is on the arm or leg, keep it raised (elevated).   Use a warm cloth on the infected area several times a day.   See your doctor for a follow-up visit as  told.  GET HELP RIGHT AWAY IF:   You are tired or confused.   You throw up (vomit).   You have watery poop (diarrhea).   You feel ill and have muscle aches.   You have a fever.  MAKE SURE YOU:   Understand these instructions.   Will watch your condition.   Will get help right away if you are not doing well or get worse.  Document Released: 02/09/2008 Document Revised: 08/12/2011 Document Reviewed: 07/25/2009 [facility] Patient Information 2012 [place], [place]. You have been given 2 prescriptions for clindamycin as appeared to be the same  thing and they are please take as follows 2 tablets every 6 hours until completed.  For one prescription at [facility], which is on and forgot last and then to fill the second one Use the supplied drops in each ear 2-3 drops 3 times a day for the next 4 days Is upright or warm compress to your face, 3 or 4 times a day for 10-15 minutes.  This will help facilitate drainage and healing

## 2012-02-11 NOTE — ED Provider Notes (Signed)
History     CSN: [id number]  Arrival date & time 02/11/12  1524   First MD Initiated Contact with Patient 02/11/12 1535      Chief Complaint  Patient presents with  . Facial Pain    (Consider location/radiation/quality/duration/timing/severity/associated sxs/prior treatment) HPI  Patient is a 31 year old male with past medical history of hypertension and hyperlipidemia presenting with at least 3 weeks of bilateral ear pain. The patient was seen 2 weeks ago and yesterday and treated for otitis externa. The patient however has not been filling his prescriptions. The patient also presented yesterday with a two-day history of right facial pain and swelling which was diagnosed as abscess on yesterday's provider note. He returns today with worsening right facial pain which he said now involves retro-orbital pain of the right eye. He causes ear pain 6/10 in his facial pain 8/10. He denies any fevers at home. He does endorse difficulty with temperature regulation and feels cold. He denies any other symptoms including headache, confusion, and difficulty looking left or right. He does endorse cloudy vision in his right eye. He denies any frank eye pain however.  On arrival temperature 98.23F, pulse 83, respirations 17, blood pressure 147/90, saturation 99% on room air  Past Medical History  Diagnosis Date  . Hypertension   . Hyperlipemia     Past Surgical History  Procedure Date  . Fracture surgery     left hand    Family History  Problem Relation Age of Onset  . Liver disease Mother   . Coronary artery disease Father     History  Substance Use Topics  . Smoking status: Current Some Day Smoker    Types: Cigars  . Smokeless tobacco: Not on file  . Alcohol Use: No      Review of Systems Constitutional: Negative for fever and chills.  HENT: POS for ear pain, sore throat and trouble swallowing.   Eyes: Negative for pain and visual disturbance.  Respiratory: Negative for cough and  shortness of breath.   Cardiovascular: Negative for chest pain and leg swelling.  Gastrointestinal: Negative for nausea, vomiting, abdominal pain and diarrhea.  Genitourinary: Negative for dysuria, urgency and frequency.  Musculoskeletal: Negative for back pain and joint swelling.  Skin: Negative for rash and POS wound.  Neurological: Negative for dizziness, syncope, speech difficulty, weakness and numbness.     Allergies  Review of patient's allergies indicates no known allergies.  Home Medications   Current Outpatient Rx  Name Route Sig Dispense Refill  . ASPIRIN EC 81 MG PO TBEC Oral Take 81 mg by mouth daily.    Marland Kitchen CLINDAMYCIN HCL 150 MG PO CAPS Oral Take 1 capsule (150 mg total) by mouth every 6 (six) hours. 28 capsule 0  . HYDROCHLOROTHIAZIDE 25 MG PO TABS Oral Take 1 tablet (25 mg total) by mouth daily. 30 tablet 1  . HYDROCODONE-ACETAMINOPHEN 5-500 MG PO TABS Oral Take 1-2 tablets by mouth every 6 (six) hours as needed for pain. 15 tablet 0  . OFLOXACIN 0.3 % OT SOLN Otic Place 5 drops in ear(s) 2 (two) times daily. 5 mL 0    BP 147/90  Pulse 83  Temp(Src) 98.7 F (37.1 C) (Oral)  Resp 17  Ht 5\' 9"  (1.753 m)  Wt 228 lb (103.42 kg)  BMI 33.67 kg/m2  SpO2 99%  Physical Exam Consitutional: Pt in no acute distress.   Head: Normocephalic and atraumatic.  Eyes: Extraocular motion intact, no scleral icterus. Vision 20/30 both eyes.  No pain with dilation.  PERRL. EOMI. No proptosis.  EARS: Erythema and swelling and bilateral external canals. TMs normal with good reflex.  Neck: Supple without meningismus, mass, or overt JVD Respiratory: Effort normal and breath sounds normal. No respiratory distress. CV: Heart regular rate and rhythm, no obvious murmurs.  Pulses +2 and symmetric Abdomen: Soft, non-tender, non-distended MSK: Extremities are atraumatic without deformity, ROM intact Skin: Warm, dry, intact Neuro: Alert and oriented, no motor deficit noted.  Psychiatric: Mood  and affect are normal   ED Course  INCISION AND DRAINAGE Date/Time: 02/11/2012 6:00 PM Performed by: Larrie Kass Authorized by: Joya Gaskins Consent: Verbal consent obtained. Written consent not obtained. Risks and benefits: risks, benefits and alternatives were discussed Consent given by: patient Patient understanding: patient states understanding of the procedure being performed Patient consent: the patient's understanding of the procedure matches consent given Procedure consent: procedure consent matches procedure scheduled Required items: required blood products, implants, devices, and special equipment available Patient identity confirmed: arm band Time out: Immediately prior to procedure a "time out" was called to verify the correct patient, procedure, equipment, support staff and site/side marked as required. Type: abscess Body area: head/neck Location details: face Anesthesia: local infiltration Local anesthetic: lidocaine 1% with epinephrine (Infraorbital block) Anesthetic total: 3 ml Risk factor: underlying major nerve Needle gauge: 18 Complexity: simple Drainage: purulent Drainage amount: moderate Wound treatment: wound left open Packing material: none Patient tolerance: Patient tolerated the procedure well with no immediate complications.   (including critical care time)   Labs Reviewed  CBC  DIFFERENTIAL  RAPID HIV SCREEN ([facility])  GLUCOSE, CAPILLARY  HEMOGLOBIN A1C   No results found.   1. Abscess   2. Otitis externa       MDM  Patient with bilateral otitis externa on exam today as well as right facial abscess which on ultrasound measured approximately 8 x 4 cm over the right zygoma. Patient is not systemically ill at this time although he does have multiple sources of infection concerning for either diabetes or perhaps HIV. We'll give the patient clindamycin here and attempts at drainage of abscess of face. In addition we will screen for  diabetes and HIV.  Neg CBG and HIV, good drainage of abscess and IV ABX given.  Pt DC to remain on previously RX ABX and opioids and FU with primary care.  PT DC home stable.  Discussed with pt the clinical impression, treatment in the ED, and follow up plan.  We alslo discussed the indications for returning to the ED, which include shortness or breath, confusion, fever, new weakness or numbness, chest pain, or any other concerning symptom.  The pt understood the treatment and plan, is stable, and is able to leave the ED.           Larrie Kass, MD 02/11/12 2030

## 2012-02-11 NOTE — ED Provider Notes (Signed)
Medical screening examination/treatment/procedure(s) were performed by non-physician practitioner and as supervising physician I was immediately available for consultation/collaboration.   Lyanne Co, MD 02/11/12 [phone]

## 2012-02-11 NOTE — ED Notes (Signed)
Earlier in week, had abscess on R cheek- came last night and had I&D done; now pt reports increased swelling, affecting eye; reports difficulty seeing out of R eye- blurry; pt does have swelling and redness to R cheek

## 2012-02-11 NOTE — ED Notes (Signed)
Pt verbalized understanding of discharge instructions, will continue to use meds he was prescribed yesterday

## 2012-02-11 NOTE — ED Notes (Signed)
POCT CBG resulted 88; RN notified

## 2012-02-11 NOTE — Discharge Instructions (Signed)
Use your eardrops.  Take the antibiotics prescribed yesterday. Use the pain medications prescribed yesterday.   See your doctor immediately--or return to the ED--with any new or troubling symptoms including fevers, weakness, new chest pain, shortness or breath, numbness, or any other concerning symptom. Abscess An abscess (boil or furuncle) is an infected area that contains a collection of pus.  SYMPTOMS Signs and symptoms of an abscess include pain, tenderness, redness, or hardness. You may feel a moveable soft area under your skin. An abscess can occur anywhere in the body.  TREATMENT  A surgical cut (incision) may be made over your abscess to drain the pus. Gauze may be packed into the space or a drain may be looped through the abscess cavity (pocket). This provides a drain that will allow the cavity to heal from the inside outwards. The abscess may be painful for a few days, but should feel much better if it was drained.  Your abscess, if seen early, may not have localized and may not have been drained. If not, another appointment may be required if it does not get better on its own or with medications. HOME CARE INSTRUCTIONS   Only take over-the-counter or prescription medicines for pain, discomfort, or fever as directed by your caregiver.   Take your antibiotics as directed if they were prescribed. Finish them even if you start to feel better.   Keep the skin and clothes clean around your abscess.   If the abscess was drained, you will need to use gauze dressing to collect any draining pus. Dressings will typically need to be changed 3 or more times a day.   The infection may spread by skin contact with others. Avoid skin contact as much as possible.   Practice good hygiene. This includes regular hand washing, cover any draining skin lesions, and do not share personal care items.   If you participate in sports, do not share athletic equipment, towels, whirlpools, or personal care items.  Shower after every practice or tournament.   If a draining area cannot be adequately covered:   Do not participate in sports.   Children should not participate in day care until the wound has healed or drainage stops.   If your caregiver has given you a follow-up appointment, it is very important to keep that appointment. Not keeping the appointment could result in a much worse infection, chronic or permanent injury, pain, and disability. If there is any problem keeping the appointment, you must call back to this facility for assistance.  SEEK MEDICAL CARE IF:   You develop increased pain, swelling, redness, drainage, or bleeding in the wound site.   You develop signs of generalized infection including muscle aches, chills, fever, or a general ill feeling.   You have an oral temperature above 102 F (38.9 C).  MAKE SURE YOU:   Understand these instructions.   Will watch your condition.   Will get help right away if you are not doing well or get worse.  Document Released: 06/02/2005 Document Revised: 08/12/2011 Document Reviewed: 03/26/2008 [facility] Patient Information 2012 [place], [place].Otitis Externa Otitis externa ("swimmer's ear") is a germ (bacterial) or fungal infection of the outer ear canal (from the eardrum to the outside of the ear). Swimming in dirty water may cause swimmer's ear. It also may be caused by moisture in the ear from water remaining after swimming or bathing. Often the first signs of infection may be itching in the ear canal. This may progress to ear canal  swelling, redness, and pus drainage, which may be signs of infection. HOME CARE INSTRUCTIONS   Apply the antibiotic drops to the ear canal as prescribed by your doctor.   This can be a very painful medical condition. A strong pain reliever may be prescribed.   Only take over-the-counter or prescription medicines for pain, discomfort, or fever as directed by your caregiver.   If your caregiver has given  you a follow-up appointment, it is very important to keep that appointment. Not keeping the appointment could result in a chronic or permanent injury, pain, hearing loss and disability. If there is any problem keeping the appointment, you must call back to this facility for assistance.  PREVENTION   It is important to keep your ear dry. Use the corner of a towel to wick water out of the ear canal after swimming or bathing.   Avoid scratching in your ear. This can damage the ear canal or remove the protective wax lining the canal and make it easier for germs (bacteria) or a fungus to grow.   You may use ear drops made of rubbing alcohol and vinegar after swimming to prevent future "swimmer's ear" infections. Make up a small bottle of equal parts white vinegar and alcohol. Put 3 or 4 drops into each ear after swimming.   Avoid swimming in lakes, polluted water, or poorly chlorinated pools.  SEEK MEDICAL CARE IF:   An oral temperature above 102 F (38.9 C) develops.   Your ear is still painful after 3 days and shows signs of getting worse (redness, swelling, pain, or pus).  MAKE SURE YOU:   Understand these instructions.   Will watch your condition.   Will get help right away if you are not doing well or get worse.  Document Released: 08/23/2005 Document Revised: 08/12/2011 Document Reviewed: 03/29/2008 [facility] Patient Information 2012 [place], [place].

## 2012-02-14 NOTE — ED Provider Notes (Signed)
I have personally seen and examined the patient.  I have discussed plan of care with resident.  I was present for key and critical portions of procedure as documented. I have reviewed the appropriate documentation on PMH/FH/Soc. History.  I have reviewed the documentation of the resident and agree.  Pt in no distress, facial abscess noted, but no visual changes, +EOMI noted Blood pressure 147/90, pulse 83, temperature 98.7 F (37.1 C), temperature source Oral, resp. rate 17, height 5\' 9"  (1.753 m), weight 228 lb (103.42 kg), SpO2 99.00%. s  Joya Gaskins, MD 02/14/12 [phone]

## 2012-04-16 ENCOUNTER — Emergency Department (HOSPITAL_COMMUNITY)
Admission: EM | Admit: 2012-04-16 | Discharge: 2012-04-16 | Disposition: A | Discharge status: Home / Self Care | Payer: Self-pay | Attending: Emergency Medicine | Admitting: Emergency Medicine

## 2012-04-16 ENCOUNTER — Encounter (HOSPITAL_COMMUNITY): Payer: Self-pay

## 2012-04-16 DIAGNOSIS — E785 Hyperlipidemia, unspecified: Secondary | ICD-10-CM | POA: Insufficient documentation

## 2012-04-16 DIAGNOSIS — W57XXXA Bitten or stung by nonvenomous insect and other nonvenomous arthropods, initial encounter: Secondary | ICD-10-CM | POA: Insufficient documentation

## 2012-04-16 DIAGNOSIS — I10 Essential (primary) hypertension: Secondary | ICD-10-CM | POA: Insufficient documentation

## 2012-04-16 DIAGNOSIS — S30860A Insect bite (nonvenomous) of lower back and pelvis, initial encounter: Secondary | ICD-10-CM | POA: Insufficient documentation

## 2012-04-16 DIAGNOSIS — L0390 Cellulitis, unspecified: Secondary | ICD-10-CM

## 2012-04-16 DIAGNOSIS — F17200 Nicotine dependence, unspecified, uncomplicated: Secondary | ICD-10-CM | POA: Insufficient documentation

## 2012-04-16 MED ORDER — OXYCODONE-ACETAMINOPHEN 5-325 MG PO TABS
1.0000 | ORAL_TABLET | Freq: Once | ORAL | Status: AC
  Administered 2012-04-16: 1 via ORAL
  Filled 2012-04-16: qty 1

## 2012-04-16 MED ORDER — DIPHENHYDRAMINE HCL 25 MG PO CAPS
50.0000 mg | ORAL_CAPSULE | Freq: Once | ORAL | Status: AC
  Administered 2012-04-16: 50 mg via ORAL
  Filled 2012-04-16: qty 2

## 2012-04-16 MED ORDER — LIDOCAINE HCL (PF) 1 % IJ SOLN
5.0000 mL | Freq: Once | INTRAMUSCULAR | Status: AC
  Administered 2012-04-16: 5 mL
  Filled 2012-04-16: qty 5

## 2012-04-16 MED ORDER — CEPHALEXIN 500 MG PO CAPS: 500.0000 mg | ORAL_CAPSULE | Freq: Four times a day (QID) | ORAL | Status: AC

## 2012-04-16 NOTE — ED Provider Notes (Signed)
History     CSN: [id number]  Arrival date & time 04/16/12  3086   First MD Initiated Contact with Patient 04/16/12 2015      Chief Complaint  Patient presents with  . Insect Bite    (Consider location/radiation/quality/duration/timing/severity/associated sxs/prior treatment) HPI Comments: Patient presents emergency department with a chief complaint of "insect sting".  Patient states he felt something bite him in his right side of his chest on Thursday.  The area has progressively become more tender and swollen.  He denies any draining from the area but does report that he has been picking at it lately.  Patient denies a history of abscesses and diabetes and has no known allergies.  He also denies fever, night sweats, chills, difficulty breathing, HA, myalgias, rash, throat closing, hives, stridor, chest pain or pleurisy.  Patient has no other complaints at this time.  The history is provided by the patient.    Past Medical History  Diagnosis Date  . Hypertension   . Hyperlipemia     Past Surgical History  Procedure Date  . Fracture surgery     left hand    Family History  Problem Relation Age of Onset  . Liver disease Mother   . Coronary artery disease Father     History  Substance Use Topics  . Smoking status: Current Some Day Smoker    Types: Cigars  . Smokeless tobacco: Not on file  . Alcohol Use: No      Review of Systems  All other systems reviewed and are negative.    Allergies  Review of patient's allergies indicates no known allergies.  Home Medications   Current Outpatient Rx  Name Route Sig Dispense Refill  . HYDROCHLOROTHIAZIDE 25 MG PO TABS Oral Take 1 tablet (25 mg total) by mouth daily. 30 tablet 1  . NAPROXEN SODIUM 220 MG PO TABS Oral Take 440 mg by mouth daily as needed. For pain/headache      BP 136/73  Pulse 110  Temp 98.5 F (36.9 C) (Oral)  Resp 18  SpO2 96%  Physical Exam  Nursing note and vitals reviewed. Constitutional:  He is oriented to person, place, and time. He appears well-developed and well-nourished. He does not have a sickly appearance. He does not appear ill. No distress.  HENT:  Head: Normocephalic and atraumatic.  Eyes: Conjunctivae and EOM are normal.  Neck: Normal range of motion. Neck supple.  Cardiovascular: Normal rate and regular rhythm.   Pulmonary/Chest: Effort normal and breath sounds normal.  Musculoskeletal: Normal range of motion. He exhibits no edema.  Lymphadenopathy:       Head (right side): No submental, no preauricular and no posterior auricular adenopathy present.       Head (left side): No submental, no submandibular, no preauricular and no posterior auricular adenopathy present.    He has no axillary adenopathy.  Neurological: He is alert and oriented to person, place, and time.  Skin: Skin is warm and dry. No rash noted. He is not diaphoretic.       1 cm sized area of fluctuance surrounded by 4cm of erythema. Tiny central scab.  Located on right chest. Extreme tenderness to palpation. Not currently draining without warmth.  Psychiatric: He has a normal mood and affect. His behavior is normal.    ED Course  Procedures (including critical care time)  Labs Reviewed - No data to display No results found.   No diagnosis found.   MDM  Abscess vs infected insect  bite  Area of fluctuance drained with only minimal drainage. Patient started on Keflex 500 mg 4 times a day.  Strict return precautions discussed including development of fever, rash, erythema spread, streaking, myalgias or HA.  Wound recheck advised in 48-72 hours.  Patient verbalizes understanding and is agreeable to plan       Jaci Carrel, PA-C 04/16/12 2204

## 2012-04-16 NOTE — ED Notes (Signed)
Rx given x1 Pt ambulating independently w/ steady gait on d/c in no acute distress, A&Ox4. D/c instructions reviewed w/ pt - pt denies any further questions or concerns at present.   

## 2012-04-16 NOTE — ED Notes (Signed)
Pt reports he was outside [employer] Thursday when he felt something "sting him" on (R) side of chest. Pt reports he noticed a red swollen area to (R) chest.

## 2012-04-17 ENCOUNTER — Emergency Department (HOSPITAL_COMMUNITY)
Admission: EM | Admit: 2012-04-17 | Discharge: 2012-04-17 | Disposition: A | Discharge status: Home / Self Care | Payer: Self-pay | Attending: Emergency Medicine | Admitting: Emergency Medicine

## 2012-04-17 ENCOUNTER — Encounter (HOSPITAL_COMMUNITY): Payer: Self-pay

## 2012-04-17 DIAGNOSIS — F17200 Nicotine dependence, unspecified, uncomplicated: Secondary | ICD-10-CM | POA: Insufficient documentation

## 2012-04-17 DIAGNOSIS — L03319 Cellulitis of trunk, unspecified: Secondary | ICD-10-CM | POA: Insufficient documentation

## 2012-04-17 DIAGNOSIS — L0390 Cellulitis, unspecified: Secondary | ICD-10-CM

## 2012-04-17 DIAGNOSIS — I10 Essential (primary) hypertension: Secondary | ICD-10-CM | POA: Insufficient documentation

## 2012-04-17 DIAGNOSIS — L02219 Cutaneous abscess of trunk, unspecified: Secondary | ICD-10-CM | POA: Insufficient documentation

## 2012-04-17 DIAGNOSIS — L0291 Cutaneous abscess, unspecified: Secondary | ICD-10-CM

## 2012-04-17 DIAGNOSIS — E785 Hyperlipidemia, unspecified: Secondary | ICD-10-CM | POA: Insufficient documentation

## 2012-04-17 MED ORDER — HYDROCODONE-ACETAMINOPHEN 5-500 MG PO TABS: 1.0000 | ORAL_TABLET | Freq: Four times a day (QID) | ORAL | Status: DC | PRN

## 2012-04-17 MED ORDER — HYDROCODONE-ACETAMINOPHEN 5-325 MG PO TABS
2.0000 | ORAL_TABLET | Freq: Once | ORAL | Status: AC
  Administered 2012-04-17: 2 via ORAL
  Filled 2012-04-17: qty 2

## 2012-04-17 MED ORDER — VANCOMYCIN HCL IN DEXTROSE 1-5 GM/200ML-% IV SOLN
1000.0000 mg | Freq: Once | INTRAVENOUS | Status: AC
  Administered 2012-04-17: 1000 mg via INTRAVENOUS
  Filled 2012-04-17: qty 200

## 2012-04-17 MED ORDER — IBUPROFEN 400 MG PO TABS
600.0000 mg | ORAL_TABLET | Freq: Once | ORAL | Status: AC
  Administered 2012-04-17: 600 mg via ORAL
  Filled 2012-04-17: qty 1

## 2012-04-17 MED ORDER — ONDANSETRON HCL 4 MG/2ML IJ SOLN
4.0000 mg | Freq: Once | INTRAMUSCULAR | Status: AC
  Administered 2012-04-17: 4 mg via INTRAVENOUS
  Filled 2012-04-17: qty 2

## 2012-04-17 MED ORDER — HYDROMORPHONE HCL PF 1 MG/ML IJ SOLN
1.0000 mg | Freq: Once | INTRAMUSCULAR | Status: AC
  Administered 2012-04-17: 1 mg via INTRAVENOUS
  Filled 2012-04-17: qty 1

## 2012-04-17 MED ORDER — SULFAMETHOXAZOLE-TRIMETHOPRIM 800-160 MG PO TABS: 1.0000 | ORAL_TABLET | Freq: Two times a day (BID) | ORAL | Status: AC

## 2012-04-17 NOTE — ED Provider Notes (Signed)
Medical screening examination/treatment/procedure(s) were performed by non-physician practitioner and as supervising physician I was immediately available for consultation/collaboration.  Helma Argyle, MD 04/17/12 0706 

## 2012-04-17 NOTE — ED Notes (Signed)
Here for follow up to insect bite, seen last pm, now redness and swelling extendingoutside border of outline

## 2012-04-17 NOTE — ED Notes (Addendum)
Dr. Denton Lank at the bedside to perform I&D to R chest abscess. Pt tolerated without difficulty. Wound packed and covered with sterile dressing. Wound care instructions discussed with patient, had no further questions.

## 2012-04-17 NOTE — ED Notes (Signed)
Pt presents to department for evaluation of abscess to R chest. States he thinks this could be an insect bite, noticed red swollen area last Thursday. Now states redness and pain increasing. Was seen yesterday and prescribed Keflex. 8/10 pain at the time. No drainage noted at present. Area tender to palpation. He is alert and oriented x4. No signs of distress noted.

## 2012-04-17 NOTE — ED Provider Notes (Signed)
History     CSN: [id number]  Arrival date & time 04/17/12  1301   First MD Initiated Contact with Patient 04/17/12 1433      Chief Complaint  Patient presents with  . Insect Bite  . Follow-up    (Consider location/radiation/quality/duration/timing/severity/associated sxs/prior treatment) The history is provided by the patient.  pt c/o area swelling, redness, tenderness right chest c/w abscess for past few days. Was in ED yesterday, had needle drainage, now states pain/redness worse. No other lesions/rash. Remote hx abscess requiring I and D in different body location. Denies hx mrsa. No injury to area. No fever or chills. No hx diabetes. Pain constant, dull, non radiating, worse w palpation.   Past Medical History  Diagnosis Date  . Hypertension   . Hyperlipemia     Past Surgical History  Procedure Date  . Fracture surgery     left hand    Family History  Problem Relation Age of Onset  . Liver disease Mother   . Coronary artery disease Father     History  Substance Use Topics  . Smoking status: Current Some Day Smoker    Types: Cigars  . Smokeless tobacco: Not on file  . Alcohol Use: No      Review of Systems  Constitutional: Negative for fever and chills.  Respiratory: Negative for shortness of breath.   Gastrointestinal: Negative for nausea and vomiting.  Skin: Negative for rash.  Neurological: Negative for headaches.    Allergies  Review of patient's allergies indicates no known allergies.  Home Medications   Current Outpatient Rx  Name Route Sig Dispense Refill  . ASPIRIN EC 81 MG PO TBEC Oral Take 81 mg by mouth daily.    . CEPHALEXIN 500 MG PO CAPS Oral Take 1 capsule (500 mg total) by mouth 4 (four) times daily. 40 capsule 0  . HYDROCHLOROTHIAZIDE 25 MG PO TABS Oral Take 1 tablet (25 mg total) by mouth daily. 30 tablet 1  . NAPROXEN SODIUM 220 MG PO TABS Oral Take 440 mg by mouth daily as needed. For pain/headache      BP 147/91  Pulse 107   Temp 98.3 F (36.8 C) (Oral)  Resp 18  SpO2 98%  Physical Exam  Nursing note and vitals reviewed. Constitutional: He is oriented to person, place, and time. He appears well-developed and well-nourished. No distress.  HENT:  Head: Atraumatic.  Eyes: Conjunctivae are normal.  Neck: Neck supple. No tracheal deviation present.  Cardiovascular: Normal rate, regular rhythm, normal heart sounds and intact distal pulses.   Pulmonary/Chest: Effort normal and breath sounds normal. No accessory muscle usage. No respiratory distress.  Abdominal: He exhibits no distension.  Musculoskeletal: Normal range of motion. He exhibits no edema and no tenderness.  Neurological: He is alert and oriented to person, place, and time.  Skin: Skin is warm and dry. No rash noted.       4-5 cm diameter abscess right chest wall medial to/not involving nipple/areola.   Small amt pus able to be expressed w palp.   Psychiatric: He has a normal mood and affect.    ED Course  Procedures (including critical care time)     MDM  Reviewed nursing notes and prior charts for additional history.   Appears as though abscess needs further drainage.  Discussed w pt - verbal consent.  Pt says did not drive, does not have to drive home. Vicodin po.   I and D.  INCISION AND DRAINAGE Performed by: Cathren Laine  E Consent: Verbal consent obtained. Risks and benefits: risks, benefits and alternatives were discussed Type: abscess  Body area: chest  Anesthesia: local infiltration  Local anesthetic: lidocaine 2% w epinephrine  Anesthetic total: 5 ml  Complexity: complex Blunt dissection to break up loculations  Drainage: purulent  Drainage amount: moderate  Packing material: 1/4 in iodoform gauze  Patient tolerance: Patient tolerated the procedure well with no immediate complications.   Given worsening abscess and early surrounding cellulitis, will give dose iv abx in ed.  Plan recheck 24-48 hrs.  rx  bactrim for home.       Suzi Roots, MD 04/17/12 [phone]

## 2012-04-19 ENCOUNTER — Encounter (HOSPITAL_COMMUNITY): Payer: Self-pay | Admitting: Emergency Medicine

## 2012-04-19 DIAGNOSIS — Z8249 Family history of ischemic heart disease and other diseases of the circulatory system: Secondary | ICD-10-CM | POA: Insufficient documentation

## 2012-04-19 DIAGNOSIS — Z8379 Family history of other diseases of the digestive system: Secondary | ICD-10-CM | POA: Insufficient documentation

## 2012-04-19 DIAGNOSIS — I10 Essential (primary) hypertension: Secondary | ICD-10-CM | POA: Insufficient documentation

## 2012-04-19 DIAGNOSIS — Z5189 Encounter for other specified aftercare: Secondary | ICD-10-CM | POA: Insufficient documentation

## 2012-04-19 DIAGNOSIS — Z7982 Long term (current) use of aspirin: Secondary | ICD-10-CM | POA: Insufficient documentation

## 2012-04-19 DIAGNOSIS — F17200 Nicotine dependence, unspecified, uncomplicated: Secondary | ICD-10-CM | POA: Insufficient documentation

## 2012-04-19 DIAGNOSIS — E785 Hyperlipidemia, unspecified: Secondary | ICD-10-CM | POA: Insufficient documentation

## 2012-04-19 NOTE — ED Notes (Signed)
PT. REPORTS PERSISTENT REDDNESS / DRAINAGE AT ABSCESS AT MID CHEST INCISED AND DRAINED 3 DAYS AGO.

## 2012-04-20 ENCOUNTER — Emergency Department (HOSPITAL_COMMUNITY)
Admission: EM | Admit: 2012-04-20 | Discharge: 2012-04-20 | Disposition: A | Discharge status: Home / Self Care | Payer: Self-pay | Attending: Emergency Medicine | Admitting: Emergency Medicine

## 2012-04-20 DIAGNOSIS — Z5189 Encounter for other specified aftercare: Secondary | ICD-10-CM

## 2012-04-20 MED ORDER — HYDROCODONE-ACETAMINOPHEN 5-325 MG PO TABS: 1.0000 | ORAL_TABLET | ORAL | Status: DC | PRN

## 2012-04-20 MED ORDER — HYDROCODONE-ACETAMINOPHEN 5-325 MG PO TABS
1.0000 | ORAL_TABLET | Freq: Once | ORAL | Status: AC
  Administered 2012-04-20: 1 via ORAL
  Filled 2012-04-20: qty 1

## 2012-04-20 NOTE — ED Notes (Signed)
Pt denies any questions upon discharge. 

## 2012-04-22 ENCOUNTER — Encounter (HOSPITAL_COMMUNITY): Payer: Self-pay | Admitting: *Deleted

## 2012-04-22 ENCOUNTER — Emergency Department (HOSPITAL_COMMUNITY)
Admission: EM | Admit: 2012-04-22 | Discharge: 2012-04-22 | Disposition: A | Discharge status: Home / Self Care | Payer: Self-pay | Attending: Emergency Medicine | Admitting: Emergency Medicine

## 2012-04-22 DIAGNOSIS — Z4800 Encounter for change or removal of nonsurgical wound dressing: Secondary | ICD-10-CM | POA: Insufficient documentation

## 2012-04-22 DIAGNOSIS — I10 Essential (primary) hypertension: Secondary | ICD-10-CM | POA: Insufficient documentation

## 2012-04-22 DIAGNOSIS — L0291 Cutaneous abscess, unspecified: Secondary | ICD-10-CM

## 2012-04-22 DIAGNOSIS — F17200 Nicotine dependence, unspecified, uncomplicated: Secondary | ICD-10-CM | POA: Insufficient documentation

## 2012-04-22 DIAGNOSIS — E785 Hyperlipidemia, unspecified: Secondary | ICD-10-CM | POA: Insufficient documentation

## 2012-04-22 DIAGNOSIS — L02219 Cutaneous abscess of trunk, unspecified: Secondary | ICD-10-CM | POA: Insufficient documentation

## 2012-04-22 NOTE — ED Notes (Signed)
Reports needing packing removed from abscess on his chest, still having pain. No acute distress noted at triage.

## 2012-04-22 NOTE — ED Provider Notes (Signed)
History  This chart was scribed for Derek Lennert, MD by Derek Ingram. This patient was seen in room TR06C/TR06C and the patient's care was started at 1745.   CSN: [id number]  Arrival date & time 04/22/12  1745   First MD Initiated Contact with Patient 04/22/12 1752      Chief Complaint  Patient presents with  . Wound Check   Patient is a 31 y.o. male presenting with abscess. The history is provided by the patient. No language interpreter was used.  Abscess  The current episode started more than one week ago. The problem occurs continuously. The problem has been unchanged. The abscess is present on the torso. The problem is moderate. Pertinent negatives include no diarrhea, no congestion and no cough. Recent Medical Care: Had his abscess packed 5 days ago and needs it removed/cleaned now.   Derek Ingram is a 31 y.o. male who presents to the Emergency Department with reports that he needs a packing removed from an abscess on the right side of his chest that was placed 5 days ago. He reports the abscess was originally packed 5 days ago, then repacked 3 days ago and now it needs to be removed/cleaned. He reports that his abscess has improved with minimal pain. He denies any other injuries/illnesses at this time. Past Medical History  Diagnosis Date  . Hypertension   . Hyperlipemia     Past Surgical History  Procedure Date  . Fracture surgery     left hand    Family History  Problem Relation Age of Onset  . Liver disease Mother   . Coronary artery disease Father     History  Substance Use Topics  . Smoking status: Current Some Ingram Smoker    Types: Cigars  . Smokeless tobacco: Not on file  . Alcohol Use: No      Review of Systems  Constitutional: Negative for fatigue.  HENT: Negative for congestion, sinus pressure and ear discharge.   Eyes: Negative for discharge.  Respiratory: Negative for cough.   Cardiovascular: Negative for chest pain.  Gastrointestinal: Negative  for abdominal pain and diarrhea.  Genitourinary: Negative for frequency and hematuria.  Musculoskeletal: Negative for back pain.  Skin: Positive for wound (Right chest absess needs packing removed. ). Negative for rash.  Neurological: Negative for seizures and headaches.  Hematological: Negative.   Psychiatric/Behavioral: Negative for hallucinations.  All other systems reviewed and are negative.    Allergies  Review of patient's allergies indicates no known allergies.  Home Medications   Current Outpatient Rx  Name Route Sig Dispense Refill  . ASPIRIN EC 81 MG PO TBEC Oral Take 81 mg by mouth daily.    . CEPHALEXIN 500 MG PO CAPS Oral Take 1 capsule (500 mg total) by mouth 4 (four) times daily. 40 capsule 0  . HYDROCHLOROTHIAZIDE 25 MG PO TABS Oral Take 1 tablet (25 mg total) by mouth daily. 30 tablet 1  . HYDROCODONE-ACETAMINOPHEN 5-325 MG PO TABS Oral Take 1 tablet by mouth every 4 (four) hours as needed for pain. 6 tablet 0  . HYDROCODONE-ACETAMINOPHEN 5-500 MG PO TABS Oral Take 1-2 tablets by mouth every 6 (six) hours as needed for pain. 20 tablet 0  . SULFAMETHOXAZOLE-TRIMETHOPRIM 800-160 MG PO TABS Oral Take 1 tablet by mouth 2 (two) times daily. 14 tablet 0    Triage Vitals: BP 147/86  Pulse 94  Temp 98.2 F (36.8 C) (Oral)  Resp 18  SpO2 98%  Physical Exam  Nursing  note and vitals reviewed. Constitutional: He is oriented to person, place, and time. He appears well-developed.  HENT:  Head: Normocephalic.  Eyes: Conjunctivae are normal.  Neck: No tracheal deviation present.  Cardiovascular:  No murmur heard. Musculoskeletal: Normal range of motion.  Neurological: He is oriented to person, place, and time.  Skin: Skin is warm.       Right chest healing abcsess. Wound packing removed and is now open 1 cm by 0.5cm  Psychiatric: He has a normal mood and affect.    ED Course  Procedures (including critical care time) DIAGNOSTIC STUDIES: Oxygen Saturation is 98% on  room air, normal by my interpretation.    COORDINATION OF CARE: At 600 PM Discussed treatment plan with patient which includes abscess packing removed and wound to be cleaned. Patient agrees.   Labs Reviewed - No data to display No results found.   1. Abscess       MDM  The chart was scribed for me under my direct supervision.  I personally performed the history, physical, and medical decision making and all procedures in the evaluation of this patient.Derek Lennert, MD 04/23/12 1236

## 2012-04-23 NOTE — ED Provider Notes (Signed)
History     CSN: [id number]  Arrival date & time 04/19/12  2343   First MD Initiated Contact with Patient 04/20/12 0139      Chief Complaint  Patient presents with  . Wound Check    (Consider location/radiation/quality/duration/timing/severity/associated sxs/prior treatment) HPI Comments: Pt had abscess to chest incised and drained 3 days ago. Has been on keflex/bactrim which he has been taking as rxed. Has noted continued drainage from the wound. Wound still painful but pain improving. No fevers.  Patient is a 31 y.o. male presenting with wound check. The history is provided by the patient.  Wound Check  He was treated in the ED 2 to 3 days ago. Previous treatment in the ED includes I&D of abscess. Treatments since wound repair include oral antibiotics and regular soap and water washings. Fever duration: none. There has been colored discharge from the wound. The redness has improved. There is no swelling present. The pain has improved.    Past Medical History  Diagnosis Date  . Hypertension   . Hyperlipemia     Past Surgical History  Procedure Date  . Fracture surgery     left hand    Family History  Problem Relation Age of Onset  . Liver disease Mother   . Coronary artery disease Father     History  Substance Use Topics  . Smoking status: Current Some Day Smoker    Types: Cigars  . Smokeless tobacco: Not on file  . Alcohol Use: No      Review of Systems  Constitutional: Negative.   Musculoskeletal: Negative.   Skin: Positive for wound. Negative for color change.    Allergies  Review of patient's allergies indicates no known allergies.  Home Medications   Current Outpatient Rx  Name Route Sig Dispense Refill  . ASPIRIN EC 81 MG PO TBEC Oral Take 81 mg by mouth daily.    . CEPHALEXIN 500 MG PO CAPS Oral Take 1 capsule (500 mg total) by mouth 4 (four) times daily. 40 capsule 0  . HYDROCHLOROTHIAZIDE 25 MG PO TABS Oral Take 1 tablet (25 mg total) by  mouth daily. 30 tablet 1  . SULFAMETHOXAZOLE-TRIMETHOPRIM 800-160 MG PO TABS Oral Take 1 tablet by mouth 2 (two) times daily. 14 tablet 0  . HYDROCODONE-ACETAMINOPHEN 5-500 MG PO TABS Oral Take 1 tablet by mouth every 6 (six) hours as needed. For pain      BP 136/90  Pulse 85  Temp 98.7 F (37.1 C) (Oral)  Resp 18  SpO2 100%  Physical Exam  Nursing note and vitals reviewed. Constitutional: He appears well-developed and well-nourished. No distress.  HENT:  Head: Normocephalic and atraumatic.  Neck: Normal range of motion.  Cardiovascular: Normal rate.   Pulmonary/Chest: Effort normal.  Musculoskeletal: Normal range of motion.  Neurological: He is alert.  Skin: Skin is warm and dry. He is not diaphoretic.       Incised abscess with pack in place to central chest wall, no overlying cellulitis. There is continued purulent drainage from the wound and fluctuance. Wound ttp.  Psychiatric: He has a normal mood and affect.    ED Course  Wound packing Performed by: Grant Fontana Authorized by: Grant Fontana Consent: Verbal consent obtained. Risks and benefits: risks, benefits and alternatives were discussed Local anesthesia used: yes Local anesthetic: lidocaine 2% with epinephrine Anesthetic total: 5 ml Patient tolerance: Patient tolerated the procedure well with no immediate complications.   (including critical care time)  Labs Reviewed -  No data to display No results found.   1. Wound check, abscess       MDM  Pt presents for wound recheck. Packing removed. There is continued fluctuance around the edges and purulent drainage. Would was re-packed. Pt instructed to cont abx until gone. To return in 2 days for recheck. Reasons to return sooner discussed.        Grant Fontana, PA-C 04/23/12 1417

## 2012-04-23 NOTE — ED Provider Notes (Signed)
Medical screening examination/treatment/procedure(s) were performed by non-physician practitioner and as supervising physician I was immediately available for consultation/collaboration.   Duke Weisensel, MD 04/23/12 1936 

## 2012-05-09 ENCOUNTER — Encounter (HOSPITAL_COMMUNITY): Payer: Self-pay | Admitting: Emergency Medicine

## 2012-05-09 ENCOUNTER — Emergency Department (HOSPITAL_COMMUNITY)
Admission: EM | Admit: 2012-05-09 | Discharge: 2012-05-09 | Disposition: A | Discharge status: Home / Self Care | Payer: Self-pay | Attending: Emergency Medicine | Admitting: Emergency Medicine

## 2012-05-09 DIAGNOSIS — H60399 Other infective otitis externa, unspecified ear: Secondary | ICD-10-CM | POA: Insufficient documentation

## 2012-05-09 DIAGNOSIS — I10 Essential (primary) hypertension: Secondary | ICD-10-CM | POA: Insufficient documentation

## 2012-05-09 DIAGNOSIS — Z91013 Allergy to seafood: Secondary | ICD-10-CM | POA: Insufficient documentation

## 2012-05-09 DIAGNOSIS — E785 Hyperlipidemia, unspecified: Secondary | ICD-10-CM | POA: Insufficient documentation

## 2012-05-09 DIAGNOSIS — H6092 Unspecified otitis externa, left ear: Secondary | ICD-10-CM

## 2012-05-09 DIAGNOSIS — F17200 Nicotine dependence, unspecified, uncomplicated: Secondary | ICD-10-CM | POA: Insufficient documentation

## 2012-05-09 MED ORDER — NEOMYCIN-COLIST-HC-THONZONIUM 3.3-3-10-0.5 MG/ML OT SUSP
4.0000 [drp] | Freq: Once | OTIC | Status: AC
  Administered 2012-05-09: 4 [drp] via OTIC
  Filled 2012-05-09 (×2): qty 5

## 2012-05-09 MED ORDER — HYDROCODONE-ACETAMINOPHEN 5-325 MG PO TABS: ORAL_TABLET | ORAL | Status: DC

## 2012-05-09 NOTE — ED Provider Notes (Signed)
History    This chart was scribed for Laray Anger, DO, MD by Smitty Pluck. The patient was seen in room TR07C and the patient's care was started at 4:27PM.   CSN: [id number]  Arrival date & time 05/09/12  1323   First MD Initiated Contact with Patient 05/09/12 1627      Chief Complaint  Patient presents with  . Otalgia    The history is provided by the patient.   Derek Ingram is a 31 y.o. male who presents to the Emergency Department complaining of gradual onset and persistence of constant left ear "pain" onset 3 days ago. Reports that he has a hx of this same complaint, and was tx with "ear drops."  States he "feels like my ear canal is closed up."  Denies any other complaints.  Denies injury, no head or neck pain, no intra-oral edema, no sore throat, no dental pain, no SOB, no rash, no facial swelling, no fevers.    Past Medical History  Diagnosis Date  . Hypertension   . Hyperlipemia     Past Surgical History  Procedure Date  . Fracture surgery     left hand    Family History  Problem Relation Age of Onset  . Liver disease Mother   . Coronary artery disease Father     History  Substance Use Topics  . Smoking status: Current Some Day Smoker    Types: Cigars  . Smokeless tobacco: Not on file  . Alcohol Use: No    Review of Systems ROS: Statement: All systems negative except as marked or noted in the HPI; Constitutional: Negative for fever and chills. ; ; Eyes: Negative for eye pain, redness and discharge. ; ; ENMT: +left ear pain. Negative for hoarseness, nasal congestion, sinus pressure and sore throat. ; ; Cardiovascular: Negative for chest pain, palpitations, diaphoresis, dyspnea and peripheral edema. ; ; Respiratory: Negative for cough, wheezing and stridor. ; ; Gastrointestinal: Negative for nausea, vomiting, diarrhea, abdominal pain, blood in stool, hematemesis, jaundice and rectal bleeding. . ; ; Genitourinary: Negative for dysuria, flank pain and  hematuria. ; ; Musculoskeletal: Negative for back pain and neck pain. Negative for swelling and trauma.; ; Skin: Negative for pruritus, rash, abrasions, blisters, bruising and skin lesion.; ; Neuro: Negative for headache, lightheadedness and neck stiffness. Negative for weakness, altered level of consciousness , altered mental status, extremity weakness, paresthesias, involuntary movement, seizure and syncope.       Allergies  Shrimp  Home Medications   Current Outpatient Rx  Name Route Sig Dispense Refill  . ASPIRIN EC 81 MG PO TBEC Oral Take 81 mg by mouth daily.    Marland Kitchen HYDROCHLOROTHIAZIDE 25 MG PO TABS Oral Take 1 tablet (25 mg total) by mouth daily. 30 tablet 1  . HYDROCODONE-ACETAMINOPHEN 5-500 MG PO TABS Oral Take 1 tablet by mouth every 6 (six) hours as needed. For pain      BP 149/89  Pulse 92  Temp 98.1 F (36.7 C) (Oral)  Resp 20  SpO2 99%  Physical Exam 1630: Physical examination:  Nursing notes reviewed; Vital signs and O2 SAT reviewed;  Constitutional: Well developed, Well nourished, Well hydrated, In no acute distress; Head:  Normocephalic, atraumatic; Eyes: EOMI, PERRL, No scleral icterus; ENMT: TM's clear and intact bilat.  +left external auditory canal with mild edema and exudate. Right canal clear.  Mastoid process NT.  Mouth and pharynx normal, Mucous membranes moist; Neck: Supple, Full range of motion, No lymphadenopathy; Cardiovascular: Regular  rate and rhythm, No murmur, rub, or gallop; Respiratory: Breath sounds clear & equal bilaterally, No rales, rhonchi, wheezes.  Speaking full sentences with ease, Normal respiratory effort/excursion; Chest: Nontender, Movement normal;; Extremities: Pulses normal, No tenderness, No edema, No calf edema or asymmetry.; Neuro: AA&Ox3, Major CN grossly intact.  Speech clear. No gross focal motor or sensory deficits in extremities.; Skin: Color normal, Warm, Dry.   ED Course  Procedures    MDM  MDM Reviewed: nursing note, vitals  and previous chart     1635:  Appears left otitis externa at this time. Was treated previously this year for right external otitis. According to [facility] chart review, pt did not fill that rx.  Will have ED RN teach and treat cortisporin otic drops here to assure pt has the medication.  Pt encouraged to f/u with ENT.  Dx d/w pt.  Questions answered.  Verb understanding, agreeable to d/c home with outpt f/u.     I personally performed the services described in this documentation, which was scribed in my presence. The recorded information has been reviewed and considered. Cyra Spader Allison Quarry, DO 05/11/12 1601

## 2012-05-09 NOTE — ED Notes (Signed)
Ear pain bilateral since last night

## 2012-05-20 ENCOUNTER — Emergency Department (HOSPITAL_COMMUNITY): Payer: No Typology Code available for payment source

## 2012-05-20 ENCOUNTER — Encounter (HOSPITAL_COMMUNITY): Payer: Self-pay | Admitting: *Deleted

## 2012-05-20 ENCOUNTER — Emergency Department (HOSPITAL_COMMUNITY)
Admission: EM | Admit: 2012-05-20 | Discharge: 2012-05-20 | Disposition: A | Discharge status: Home / Self Care | Payer: No Typology Code available for payment source | Attending: Emergency Medicine | Admitting: Emergency Medicine

## 2012-05-20 DIAGNOSIS — M79609 Pain in unspecified limb: Secondary | ICD-10-CM | POA: Insufficient documentation

## 2012-05-20 DIAGNOSIS — M545 Low back pain, unspecified: Secondary | ICD-10-CM | POA: Insufficient documentation

## 2012-05-20 DIAGNOSIS — F17200 Nicotine dependence, unspecified, uncomplicated: Secondary | ICD-10-CM | POA: Insufficient documentation

## 2012-05-20 DIAGNOSIS — E785 Hyperlipidemia, unspecified: Secondary | ICD-10-CM | POA: Insufficient documentation

## 2012-05-20 DIAGNOSIS — Z91013 Allergy to seafood: Secondary | ICD-10-CM | POA: Insufficient documentation

## 2012-05-20 DIAGNOSIS — I10 Essential (primary) hypertension: Secondary | ICD-10-CM | POA: Insufficient documentation

## 2012-05-20 DIAGNOSIS — M546 Pain in thoracic spine: Secondary | ICD-10-CM | POA: Insufficient documentation

## 2012-05-20 MED ORDER — IBUPROFEN 800 MG PO TABS: 800.0000 mg | ORAL_TABLET | Freq: Three times a day (TID) | ORAL | Status: AC

## 2012-05-20 MED ORDER — OXYCODONE-ACETAMINOPHEN 5-325 MG PO TABS
2.0000 | ORAL_TABLET | Freq: Once | ORAL | Status: AC
  Administered 2012-05-20: 2 via ORAL
  Filled 2012-05-20: qty 2
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
## 2012-05-20 NOTE — ED Notes (Signed)
Patient transported to X-ray 
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
## 2012-05-20 NOTE — ED Provider Notes (Signed)
Medical screening examination/treatment/procedure(s) were performed by non-physician practitioner and as supervising physician I was immediately available for consultation/collaboration.  Ivelisse Culverhouse M Costas Sena, MD 05/20/12 0702 

## 2012-05-27 ENCOUNTER — Encounter (HOSPITAL_COMMUNITY): Payer: Self-pay | Admitting: *Deleted

## 2012-05-27 ENCOUNTER — Emergency Department (HOSPITAL_COMMUNITY)
Admission: EM | Admit: 2012-05-27 | Discharge: 2012-05-27 | Disposition: A | Discharge status: Home / Self Care | Payer: No Typology Code available for payment source | Attending: Emergency Medicine | Admitting: Emergency Medicine

## 2012-05-27 ENCOUNTER — Emergency Department (HOSPITAL_COMMUNITY): Payer: No Typology Code available for payment source

## 2012-05-27 DIAGNOSIS — S93401A Sprain of unspecified ligament of right ankle, initial encounter: Secondary | ICD-10-CM

## 2012-05-27 DIAGNOSIS — I10 Essential (primary) hypertension: Secondary | ICD-10-CM | POA: Insufficient documentation

## 2012-05-27 DIAGNOSIS — F17200 Nicotine dependence, unspecified, uncomplicated: Secondary | ICD-10-CM | POA: Insufficient documentation

## 2012-05-27 DIAGNOSIS — Z91013 Allergy to seafood: Secondary | ICD-10-CM | POA: Insufficient documentation

## 2012-05-27 DIAGNOSIS — S93409A Sprain of unspecified ligament of unspecified ankle, initial encounter: Secondary | ICD-10-CM | POA: Insufficient documentation

## 2012-05-27 DIAGNOSIS — Z8379 Family history of other diseases of the digestive system: Secondary | ICD-10-CM | POA: Insufficient documentation

## 2012-05-27 DIAGNOSIS — Z8249 Family history of ischemic heart disease and other diseases of the circulatory system: Secondary | ICD-10-CM | POA: Insufficient documentation

## 2012-05-27 DIAGNOSIS — Z7982 Long term (current) use of aspirin: Secondary | ICD-10-CM | POA: Insufficient documentation

## 2012-05-27 MED ORDER — TRAMADOL HCL 50 MG PO TABS
50.0000 mg | ORAL_TABLET | Freq: Once | ORAL | Status: AC
  Administered 2012-05-27: 50 mg via ORAL
  Filled 2012-05-27: qty 1

## 2012-05-27 MED ORDER — IBUPROFEN 800 MG PO TABS: 800.0000 mg | ORAL_TABLET | Freq: Three times a day (TID) | ORAL | Status: DC | PRN

## 2012-05-27 MED ORDER — IBUPROFEN 400 MG PO TABS
800.0000 mg | ORAL_TABLET | Freq: Once | ORAL | Status: AC
  Administered 2012-05-27: 800 mg via ORAL
  Filled 2012-05-27: qty 2

## 2012-05-27 MED ORDER — TRAMADOL HCL 50 MG PO TABS: 50.0000 mg | ORAL_TABLET | Freq: Four times a day (QID) | ORAL | Status: DC | PRN

## 2012-05-27 NOTE — ED Notes (Signed)
Patient transported to X-ray 
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
## 2012-05-31 ENCOUNTER — Emergency Department (HOSPITAL_COMMUNITY)
Admission: EM | Admit: 2012-05-31 | Discharge: 2012-05-31 | Disposition: A | Discharge status: Home / Self Care | Payer: Self-pay | Attending: Emergency Medicine | Admitting: Emergency Medicine

## 2012-05-31 ENCOUNTER — Encounter (HOSPITAL_COMMUNITY): Payer: Self-pay | Admitting: Family Medicine

## 2012-05-31 ENCOUNTER — Emergency Department (HOSPITAL_COMMUNITY): Payer: Self-pay

## 2012-05-31 DIAGNOSIS — F17200 Nicotine dependence, unspecified, uncomplicated: Secondary | ICD-10-CM | POA: Insufficient documentation

## 2012-05-31 DIAGNOSIS — I10 Essential (primary) hypertension: Secondary | ICD-10-CM | POA: Insufficient documentation

## 2012-05-31 DIAGNOSIS — Z8379 Family history of other diseases of the digestive system: Secondary | ICD-10-CM | POA: Insufficient documentation

## 2012-05-31 DIAGNOSIS — Z7982 Long term (current) use of aspirin: Secondary | ICD-10-CM | POA: Insufficient documentation

## 2012-05-31 DIAGNOSIS — Z91013 Allergy to seafood: Secondary | ICD-10-CM | POA: Insufficient documentation

## 2012-05-31 DIAGNOSIS — Z8249 Family history of ischemic heart disease and other diseases of the circulatory system: Secondary | ICD-10-CM | POA: Insufficient documentation

## 2012-05-31 DIAGNOSIS — J40 Bronchitis, not specified as acute or chronic: Secondary | ICD-10-CM | POA: Insufficient documentation

## 2012-05-31 MED ORDER — ALBUTEROL SULFATE HFA 108 (90 BASE) MCG/ACT IN AERS: 2.0000 | INHALATION_SPRAY | RESPIRATORY_TRACT | Status: DC | PRN

## 2012-05-31 MED ORDER — PREDNISONE 50 MG PO TABS: ORAL_TABLET | ORAL | Status: DC

## 2012-05-31 MED ORDER — IPRATROPIUM BROMIDE 0.02 % IN SOLN
0.5000 mg | Freq: Once | RESPIRATORY_TRACT | Status: AC
Start: 2012-05-31 — End: 2012-05-31
  Administered 2012-05-31: 0.5 mg via RESPIRATORY_TRACT
  Filled 2012-05-31: qty 2.5

## 2012-05-31 MED ORDER — AZITHROMYCIN 250 MG PO TABS: 250.0000 mg | ORAL_TABLET | Freq: Every day | ORAL | Status: DC

## 2012-05-31 MED ORDER — ALBUTEROL SULFATE (5 MG/ML) 0.5% IN NEBU
2.5000 mg | INHALATION_SOLUTION | Freq: Once | RESPIRATORY_TRACT | Status: AC
  Administered 2012-05-31: 2.5 mg via RESPIRATORY_TRACT
  Filled 2012-05-31: qty 0.5

## 2012-05-31 MED ORDER — PREDNISONE 20 MG PO TABS
60.0000 mg | ORAL_TABLET | Freq: Once | ORAL | Status: AC
  Administered 2012-05-31: 60 mg via ORAL
  Filled 2012-05-31: qty 3

## 2012-05-31 NOTE — ED Provider Notes (Signed)
History  This chart was scribed for Glynn Octave, MD by Ardeen Jourdain. This patient was seen in room TR08C/TR08C and the patient's care was started at 1851.  CSN: [id number]  Arrival date & time 05/31/12  1730   None     Chief Complaint  Patient presents with  . Shortness of Breath  . Cough     The history is provided by the patient. No language interpreter was used.    Derek Ingram is a 31 y.o. male who presents to the Emergency Department complaining of a productive cough with associated chills, SOB, congestion, rhinorrhea, chest pain, back pain and diaphrosis. He states that the symptoms started 3 days ago.  He denies neck pain, sore throat, visual disturbance, abdominal pain, nausea, emesis, diarrhea, urinary symptoms, HA, weakness, numbness and rash as associated symptoms.  He denies sick contact. He reports normal input and output. He has a history of HTN and hyperlipemia. States he takes 81 mg aspirin daily. He denies any h/o asthma. He states that he used to smoke but has not smoked for the past two weeks and denies alcohol use.  Seen 4 days ago for ankle sprain.  Past Medical History  Diagnosis Date  . Hypertension   . Hyperlipemia     Past Surgical History  Procedure Date  . Fracture surgery     left hand    Family History  Problem Relation Age of Onset  . Liver disease Mother   . Coronary artery disease Father     History  Substance Use Topics  . Smoking status: Current Some Day Smoker    Types: Cigars  . Smokeless tobacco: Never Used  . Alcohol Use: No      Review of Systems  A complete 10 system review of systems was obtained and all systems are negative except as noted in the HPI and PMH.    Allergies  Shrimp  Home Medications   Current Outpatient Rx  Name Route Sig Dispense Refill  . ASPIRIN EC 81 MG PO TBEC Oral Take 81 mg by mouth daily.    Marland Kitchen HYDROCHLOROTHIAZIDE 25 MG PO TABS Oral Take 1 tablet (25 mg total) by mouth daily. 30  tablet 1  . IBUPROFEN 800 MG PO TABS Oral Take 1 tablet (800 mg total) by mouth every 8 (eight) hours as needed for pain. 30 tablet 0  . IBUPROFEN 800 MG PO TABS Oral Take 1 tablet (800 mg total) by mouth 3 (three) times daily. 21 tablet 0    Triage Vitals: BP 153/96  Pulse 74  Temp 97.6 F (36.4 C) (Oral)  Resp 18  SpO2 97%  Physical Exam  Nursing note and vitals reviewed. Constitutional: He is oriented to person, place, and time. He appears well-developed and well-nourished. No distress.  HENT:  Head: Normocephalic and atraumatic.  Mouth/Throat: Oropharynx is clear and moist. No oropharyngeal exudate.  Eyes: EOM are normal.  Neck: Normal range of motion. Neck supple. No tracheal deviation present.  Cardiovascular: Normal rate, regular rhythm and normal heart sounds.   Pulmonary/Chest: Effort normal. No respiratory distress. He has wheezes.       Pt speaking in full sentences. Coarse breath sounds. Expiratory wheezes.   Abdominal: Soft. Bowel sounds are normal. There is no tenderness.  Musculoskeletal: Normal range of motion.  Neurological: He is alert and oriented to person, place, and time.  Skin: Skin is warm and dry.  Psychiatric: He has a normal mood and affect. His behavior is normal.  ED Course  Procedures (including critical care time)  DIAGNOSTIC STUDIES: Oxygen Saturation is 97% on room air, adequate by my interpretation.    COORDINATION OF CARE:  5784- Discussed treatment plan with pt at bedside and pt agreed to plan. A CXR was ordered.   1900- Medication Order- albuterol (PROVENTIL) (5 MG/ML) 0.5% nebulizer solution 2.5 mg Once; ipratropium (ATROVENT) nebulizer solution 0.5 mg Once, predniSONE (DELTASONE) tablet 60 mg Once     Labs Reviewed - No data to display Dg Chest 2 View  05/31/2012  *RADIOLOGY REPORT*  Clinical Data: 31 year old male cough shortness of breath fever chills body aches.  CHEST - 2 VIEW  Comparison: Thoracic radiographs 05/20/2012.   Findings: Low lung volumes.  Cardiac size at the upper limits of normal to mildly enlarged. Other mediastinal contours are within normal limits.  Visualized tracheal air column is within normal limits.  No pneumothorax, pleural effusion, edema, or consolidation.  Mild streaky retrocardiac opacity most resembles atelectasis.  No other confluent pulmonary opacity. No acute osseous abnormality identified.  IMPRESSION: Low lung volumes with mild retrocardiac atelectasis.   Original Report Authenticated By: Harley Hallmark, M.D.      No diagnosis found.    MDM  Cough, congestion, body aches, chills. No documented fevers. Wheezing on exam without respiratory distress or increased work of breathing.  Suspect bronchitis. Nebs, steroids, CXR. No chest pain.  PERC negative.  Ambulatory in ED without desaturation.      I personally performed the services described in this documentation, which was scribed in my presence.  The recorded information has been reviewed and considered.    Glynn Octave, MD 06/01/12 1051

## 2012-05-31 NOTE — ED Notes (Signed)
Per pt he has had a productive cough, cold chills, and body aches for a few days.
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
## 2013-06-10 ENCOUNTER — Emergency Department (HOSPITAL_COMMUNITY)
Admission: EM | Admit: 2013-06-10 | Discharge: 2013-06-10 | Disposition: A | Discharge status: Home / Self Care | Payer: No Typology Code available for payment source | Attending: Emergency Medicine | Admitting: Emergency Medicine

## 2013-06-10 ENCOUNTER — Encounter (HOSPITAL_COMMUNITY): Payer: Self-pay | Admitting: *Deleted

## 2013-06-10 DIAGNOSIS — Y929 Unspecified place or not applicable: Secondary | ICD-10-CM | POA: Insufficient documentation

## 2013-06-10 DIAGNOSIS — F1010 Alcohol abuse, uncomplicated: Secondary | ICD-10-CM

## 2013-06-10 DIAGNOSIS — Z792 Long term (current) use of antibiotics: Secondary | ICD-10-CM | POA: Insufficient documentation

## 2013-06-10 DIAGNOSIS — J45901 Unspecified asthma with (acute) exacerbation: Secondary | ICD-10-CM | POA: Insufficient documentation

## 2013-06-10 DIAGNOSIS — F1020 Alcohol dependence, uncomplicated: Secondary | ICD-10-CM | POA: Insufficient documentation

## 2013-06-10 DIAGNOSIS — S61509A Unspecified open wound of unspecified wrist, initial encounter: Secondary | ICD-10-CM | POA: Insufficient documentation

## 2013-06-10 DIAGNOSIS — Y9389 Activity, other specified: Secondary | ICD-10-CM | POA: Insufficient documentation

## 2013-06-10 DIAGNOSIS — IMO0002 Reserved for concepts with insufficient information to code with codable children: Secondary | ICD-10-CM | POA: Insufficient documentation

## 2013-06-10 DIAGNOSIS — F17200 Nicotine dependence, unspecified, uncomplicated: Secondary | ICD-10-CM | POA: Insufficient documentation

## 2013-06-10 DIAGNOSIS — Z79899 Other long term (current) drug therapy: Secondary | ICD-10-CM | POA: Insufficient documentation

## 2013-06-10 DIAGNOSIS — F329 Major depressive disorder, single episode, unspecified: Secondary | ICD-10-CM | POA: Insufficient documentation

## 2013-06-10 DIAGNOSIS — Z7982 Long term (current) use of aspirin: Secondary | ICD-10-CM | POA: Insufficient documentation

## 2013-06-10 DIAGNOSIS — W292XXA Contact with other powered household machinery, initial encounter: Secondary | ICD-10-CM | POA: Insufficient documentation

## 2013-06-10 DIAGNOSIS — F3289 Other specified depressive episodes: Secondary | ICD-10-CM | POA: Insufficient documentation

## 2013-06-10 DIAGNOSIS — I10 Essential (primary) hypertension: Secondary | ICD-10-CM | POA: Insufficient documentation

## 2013-06-10 LAB — COMPREHENSIVE METABOLIC PANEL
BUN: 12 mg/dL (ref 6–23)
CO2: 22 mEq/L (ref 19–32)
Calcium: 9.4 mg/dL (ref 8.4–10.5)
Creatinine, Ser: 1.01 mg/dL (ref 0.50–1.35)
GFR calc Af Amer: >90 mL/min (ref >90)
GFR calc non Af Amer: >90 mL/min (ref >90)
Glucose, Bld: 88 mg/dL (ref 70–99)
Total Protein: 8.1 g/dL (ref 6.0–8.3)

## 2013-06-10 LAB — CBC WITH DIFFERENTIAL/PLATELET
Eosinophils Absolute: 0.1 10*3/uL (ref 0.0–0.7)
Eosinophils Relative: 3 % (ref 0–5)
HCT: 43.3 % (ref 39.0–52.0)
Hemoglobin: 14.9 g/dL (ref 13.0–17.0)
Lymphocytes Relative: 38 % (ref 12–46)
Lymphs Abs: 1.7 10*3/uL (ref 0.7–4.0)
MCH: 29.6 pg (ref 26.0–34.0)
MCV: 86.1 fL (ref 78.0–100.0)
Monocytes Absolute: 0.3 10*3/uL (ref 0.1–1.0)
Monocytes Relative: 8 % (ref 3–12)
Platelets: 188 10*3/uL (ref 150–400)
RBC: 5.03 MIL/uL (ref 4.22–5.81)

## 2013-06-10 LAB — RAPID URINE DRUG SCREEN, HOSP PERFORMED
Amphetamines: POSITIVE — AB
Benzodiazepines: NOT DETECTED
Opiates: NOT DETECTED

## 2013-06-10 LAB — ETHANOL: Alcohol, Ethyl (B): 105 mg/dL — ABNORMAL HIGH (ref 0–11)

## 2013-06-10 MED ORDER — ZOLPIDEM TARTRATE 5 MG PO TABS: 5.0000 mg | ORAL_TABLET | Freq: Every evening | ORAL | Status: DC | PRN

## 2013-06-10 MED ORDER — IPRATROPIUM BROMIDE 0.02 % IN SOLN
0.5000 mg | Freq: Once | RESPIRATORY_TRACT | Status: AC
  Administered 2013-06-10: 0.5 mg via RESPIRATORY_TRACT
  Filled 2013-06-10: qty 2.5

## 2013-06-10 MED ORDER — ALBUTEROL SULFATE (5 MG/ML) 0.5% IN NEBU
5.0000 mg | INHALATION_SOLUTION | Freq: Once | RESPIRATORY_TRACT | Status: AC
  Administered 2013-06-10: 5 mg via RESPIRATORY_TRACT
  Filled 2013-06-10: qty 1

## 2013-06-10 MED ORDER — ALBUTEROL SULFATE HFA 108 (90 BASE) MCG/ACT IN AERS
2.0000 | INHALATION_SPRAY | RESPIRATORY_TRACT | Status: DC | PRN
  Administered 2013-06-10: 2 via RESPIRATORY_TRACT
  Filled 2013-06-10: qty 6.7

## 2013-06-10 MED ORDER — ACETAMINOPHEN 325 MG PO TABS: 650.0000 mg | ORAL_TABLET | ORAL | Status: DC | PRN

## 2013-06-10 NOTE — ED Provider Notes (Signed)
Medical screening examination/treatment/procedure(s) were performed by non-physician practitioner and as supervising physician I was immediately available for consultation/collaboration.   Junius Argyle, MD 06/10/13 2059

## 2013-06-10 NOTE — ED Provider Notes (Signed)
CSN: [id number]     Arrival date & time 06/10/13  1122 History   First MD Initiated Contact with Patient 06/10/13 1139     Chief Complaint  Patient presents with  . Medical Clearance   (Consider location/radiation/quality/duration/timing/severity/associated sxs/prior Treatment) Patient is a 32 y.o. male presenting with alcohol problem. The history is provided by the patient. No language interpreter was used.  Alcohol Problem Pertinent negatives include no chills, fever or headaches. Associated symptoms comments: He presents asking for help with alcoholism and depression. He reports, and wife coroberates, that he cut his hand because he was angry earlier this morning. He denies suicidal intent. He has no history of suicidal behavior or self harm. He denies HI. No history of seizures with alcohol cessation. He also complains of wheezing on a frequent basis. He has a history of childhood asthma but does not currently use inhalers. No fever. .    Past Medical History  Diagnosis Date  . Hypertension   . Hyperlipemia    Past Surgical History  Procedure Laterality Date  . Fracture surgery      left hand   Family History  Problem Relation Age of Onset  . Liver disease Mother   . Coronary artery disease Father    History  Substance Use Topics  . Smoking status: Current Some Day Smoker    Types: Cigars  . Smokeless tobacco: Never Used  . Alcohol Use: No    Review of Systems  Constitutional: Negative for fever and chills.  Respiratory: Positive for wheezing.   Cardiovascular: Negative.   Gastrointestinal: Negative.   Genitourinary: Negative.   Musculoskeletal: Negative.   Skin: Positive for wound.  Neurological: Negative.  Negative for headaches.    Allergies  Shrimp  Home Medications   Current Outpatient Rx  Name  Route  Sig  Dispense  Refill  . albuterol (PROVENTIL HFA;VENTOLIN HFA) 108 (90 BASE) MCG/ACT inhaler   Inhalation   Inhale 2 puffs into the lungs every 4  (four) hours as needed for wheezing.   1 Inhaler   0   . aspirin EC 81 MG tablet   Oral   Take 81 mg by mouth daily.         Marland Kitchen azithromycin (ZITHROMAX) 250 MG tablet   Oral   Take 1 tablet (250 mg total) by mouth daily. Take first 2 tablets together, then 1 every day until finished.   6 tablet   0   . EXPIRED: hydrochlorothiazide (HYDRODIURIL) 25 MG tablet   Oral   Take 1 tablet (25 mg total) by mouth daily.   30 tablet   1   . ibuprofen (ADVIL,MOTRIN) 800 MG tablet   Oral   Take 1 tablet (800 mg total) by mouth every 8 (eight) hours as needed for pain.   30 tablet   0   . predniSONE (DELTASONE) 50 MG tablet      1 tablet PO daily   5 tablet   0    BP 143/94  Pulse 80  Temp(Src) 98.1 F (36.7 C) (Oral)  Resp 16  Wt 214 lb 8 oz (97.297 kg)  BMI 31.66 kg/m2  SpO2 97% Physical Exam  Constitutional: He is oriented to person, place, and time. He appears well-developed and well-nourished.  HENT:  Head: Normocephalic.  Neck: Normal range of motion. Neck supple.  Cardiovascular: Normal rate and regular rhythm.   Pulmonary/Chest: Effort normal. He has wheezes. He exhibits no tenderness.  Abdominal: Soft. Bowel sounds are normal. There  is no tenderness. There is no rebound and no guarding.  Musculoskeletal: Normal range of motion.  Neurological: He is alert and oriented to person, place, and time.  Skin: Skin is warm and dry. No rash noted.  3 cm linear laceration lateral wrist at thumb base.   Psychiatric: He has a normal mood and affect.    ED Course  Procedures (including critical care time) Labs Review Labs Reviewed  CBC WITH DIFFERENTIAL  COMPREHENSIVE METABOLIC PANEL  URINE RAPID DRUG SCREEN (HOSP PERFORMED)  ETHANOL   Results for orders placed during the hospital encounter of 06/10/13  CBC WITH DIFFERENTIAL      Result Value Range   WBC 4.4  4.0 - 10.5 K/uL   RBC 5.03  4.22 - 5.81 MIL/uL   Hemoglobin 14.9  13.0 - 17.0 g/dL   HCT 16.1  09.6 - 04.5  %   MCV 86.1  78.0 - 100.0 fL   MCH 29.6  26.0 - 34.0 pg   MCHC 34.4  30.0 - 36.0 g/dL   RDW 40.9  81.1 - 91.4 %   Platelets 188  150 - 400 K/uL   Neutrophils Relative % 52  43 - 77 %   Neutro Abs 2.3  1.7 - 7.7 K/uL   Lymphocytes Relative 38  12 - 46 %   Lymphs Abs 1.7  0.7 - 4.0 K/uL   Monocytes Relative 8  3 - 12 %   Monocytes Absolute 0.3  0.1 - 1.0 K/uL   Eosinophils Relative 3  0 - 5 %   Eosinophils Absolute 0.1  0.0 - 0.7 K/uL   Basophils Relative 1  0 - 1 %   Basophils Absolute 0.0  0.0 - 0.1 K/uL  COMPREHENSIVE METABOLIC PANEL      Result Value Range   Sodium 140  135 - 145 mEq/L   Potassium 4.0  3.5 - 5.1 mEq/L   Chloride 105  96 - 112 mEq/L   CO2 22  19 - 32 mEq/L   Glucose, Bld 88  70 - 99 mg/dL   BUN 12  6 - 23 mg/dL   Creatinine, Ser 7.82  0.50 - 1.35 mg/dL   Calcium 9.4  8.4 - 95.6 mg/dL   Total Protein 8.1  6.0 - 8.3 g/dL   Albumin 4.0  3.5 - 5.2 g/dL   AST 27  0 - 37 U/L   ALT 29  0 - 53 U/L   Alkaline Phosphatase 78  39 - 117 U/L   Total Bilirubin 0.2 (*) 0.3 - 1.2 mg/dL   GFR calc non Af Amer >90  >90 mL/min   GFR calc Af Amer >90  >90 mL/min  URINE RAPID DRUG SCREEN (HOSP PERFORMED)      Result Value Range   Opiates NONE DETECTED  NONE DETECTED   Cocaine NONE DETECTED  NONE DETECTED   Benzodiazepines NONE DETECTED  NONE DETECTED   Amphetamines POSITIVE (*) NONE DETECTED   Tetrahydrocannabinol NONE DETECTED  NONE DETECTED   Barbiturates NONE DETECTED  NONE DETECTED  ETHANOL      Result Value Range   Alcohol, Ethyl (B) 105 (*) 0 - 11 mg/dL    Imaging Review No results found.  MDM  No diagnosis found. 1. Alcohol dependence 2. Laceration left hand  He reports he does not want to wait for psychiatric evaluation. He continues to deny SI. Wife at bedside reports no history of or current SI. He admits to problem with alcohol but does  not want help today, but knows he can return with any worsening symptoms. He consistently denies HI - wife agrees and  feels safe taking patient home.     Arnoldo Hooker, PA-C 06/10/13 1538

## 2013-06-10 NOTE — ED Provider Notes (Signed)
LACERATION REPAIR Performed by: Wynetta Emery Authorized by: Wynetta Emery Consent: Verbal consent obtained. Risks and benefits: risks, benefits and alternatives were discussed Consent given by: patient Patient identity confirmed: Wrist band  Prepped and Draped in normal sterile fashion  Laceration Location: left medial wrist  Laceration Length: 3cm  Anesthesia: local  Local anesthetic: 2% without epinephrine  Anesthetic total: 2 ml  Irrigation method: syringe  Amount of cleaning: copious   Wound explored to depth in good light on a bloodless field with no foreign bodies seen or palpated.   Skin closure: 4-0 ethylon  Number of sutures: 4  Technique: simple interupted  Patient tolerance: Patient tolerated the procedure well with no immediate complications.  Antibx ointment applied. Instructions for care discussed verbally and patient provided with additional written instructions for homecare and f/u.  Wynetta Emery, PA-C 06/10/13 1426

## 2013-06-10 NOTE — ED Notes (Signed)
Pt reports cutting his left wrist this morning with a kitchen knife "because he was mad." and he also took 2 benadryl. He denies trying to harm himself. His wife states that he did have intention to harm himself and that he is an alcoholic and needs help. Pt calm at triage but states that he doesn't want to stay here. Tetanus current.

## 2013-06-18 NOTE — ED Provider Notes (Signed)
Medical screening examination/treatment/procedure(s) were performed by non-physician practitioner and as supervising physician I was immediately available for consultation/collaboration.  Briante Loveall, MD 06/18/13 0915 

## 2015-10-08 ENCOUNTER — Emergency Department (HOSPITAL_COMMUNITY)
Admission: EM | Admit: 2015-10-08 | Discharge: 2015-10-08 | Disposition: A | Discharge status: Home / Self Care | Payer: Self-pay | Attending: Emergency Medicine | Admitting: Emergency Medicine

## 2015-10-08 ENCOUNTER — Emergency Department (HOSPITAL_COMMUNITY): Payer: Self-pay

## 2015-10-08 ENCOUNTER — Encounter (HOSPITAL_COMMUNITY): Payer: Self-pay | Admitting: Emergency Medicine

## 2015-10-08 DIAGNOSIS — K219 Gastro-esophageal reflux disease without esophagitis: Secondary | ICD-10-CM | POA: Insufficient documentation

## 2015-10-08 DIAGNOSIS — Z8639 Personal history of other endocrine, nutritional and metabolic disease: Secondary | ICD-10-CM | POA: Insufficient documentation

## 2015-10-08 DIAGNOSIS — J45901 Unspecified asthma with (acute) exacerbation: Secondary | ICD-10-CM | POA: Insufficient documentation

## 2015-10-08 DIAGNOSIS — F17210 Nicotine dependence, cigarettes, uncomplicated: Secondary | ICD-10-CM | POA: Insufficient documentation

## 2015-10-08 DIAGNOSIS — I10 Essential (primary) hypertension: Secondary | ICD-10-CM | POA: Insufficient documentation

## 2015-10-08 MED ORDER — DEXAMETHASONE 4 MG PO TABS
10.0000 mg | ORAL_TABLET | Freq: Once | ORAL | Status: AC
  Administered 2015-10-08: 10 mg via ORAL
  Filled 2015-10-08: qty 2

## 2015-10-08 MED ORDER — AEROCHAMBER PLUS W/MASK MISC
1.0000 | Freq: Once | Status: DC
  Filled 2015-10-08: qty 1

## 2015-10-08 MED ORDER — IPRATROPIUM-ALBUTEROL 0.5-2.5 (3) MG/3ML IN SOLN
3.0000 mL | RESPIRATORY_TRACT | Status: AC
Start: 2015-10-08 — End: 2015-10-08
  Administered 2015-10-08 (×3): 3 mL via RESPIRATORY_TRACT
  Filled 2015-10-08: qty 3
  Filled 2015-10-08: qty 9

## 2015-10-08 MED ORDER — GI COCKTAIL ~~LOC~~
30.0000 mL | Freq: Once | ORAL | Status: AC
  Administered 2015-10-08: 30 mL via ORAL
  Filled 2015-10-08: qty 30

## 2015-10-08 MED ORDER — ALBUTEROL SULFATE HFA 108 (90 BASE) MCG/ACT IN AERS
2.0000 | INHALATION_SPRAY | Freq: Once | RESPIRATORY_TRACT | Status: AC
  Administered 2015-10-08: 2 via RESPIRATORY_TRACT
  Filled 2015-10-08: qty 6.7

## 2015-10-08 NOTE — ED Notes (Signed)
Patient presents for SOB, abdominal pain, nausea onset 1530. Denies N/V/D, last BM 6 days ago.

## 2015-10-08 NOTE — ED Provider Notes (Signed)
CSN: [id number]     Arrival date & time 10/08/15  2028 History   First MD Initiated Contact with Patient 10/08/15 2050     Chief Complaint  Patient presents with  . Shortness of Breath  . Abdominal Pain     (Consider location/radiation/quality/duration/timing/severity/associated sxs/prior Treatment) Patient is a 35 y.o. male presenting with shortness of breath and abdominal pain.  Shortness of Breath Severity:  Mild Onset quality:  Gradual Duration:  1 day Timing:  Constant Progression:  Worsening Chronicity:  Recurrent Relieved by:  Nothing Worsened by:  Nothing tried Ineffective treatments:  None tried Associated symptoms: wheezing   Associated symptoms: no abdominal pain, no chest pain, no fever, no headaches, no rash and no vomiting   Abdominal Pain Associated symptoms: shortness of breath   Associated symptoms: no chest pain, no chills, no diarrhea, no fever and no vomiting     60 y oM  With a chief complaints of shortness of breath. This been going on for the past day. Patient has a remote history of asthma. States it feels like the same. Having wheezing worsening throughout the day. Patient also having some epigastric tightness. Patient states it's worse with eating. Denies radiation. Has had some cough and congestion as well. Denies fevers or chills. Was recently incarcerated. Denies any other PE risk factors.  Past Medical History  Diagnosis Date  . Hypertension   . Hyperlipemia    Past Surgical History  Procedure Laterality Date  . Fracture surgery      left hand   Family History  Problem Relation Age of Onset  . Liver disease Mother   . Coronary artery disease Father    Social History  Substance Use Topics  . Smoking status: Current Some Day Smoker    Types: Cigars  . Smokeless tobacco: Never Used  . Alcohol Use: No    Review of Systems  Constitutional: Negative for fever and chills.  HENT: Negative for congestion and facial swelling.   Eyes:  Negative for discharge and visual disturbance.  Respiratory: Positive for chest tightness, shortness of breath and wheezing.   Cardiovascular: Negative for chest pain and palpitations.  Gastrointestinal: Negative for vomiting, abdominal pain and diarrhea.  Musculoskeletal: Negative for myalgias and arthralgias.  Skin: Negative for color change and rash.  Neurological: Negative for tremors, syncope and headaches.  Psychiatric/Behavioral: Negative for confusion and dysphoric mood.      Allergies  Shrimp  Home Medications   Prior to Admission medications   Medication Sig Start Date End Date Taking? Authorizing Provider  ibuprofen (ADVIL,MOTRIN) 200 MG tablet Take 400 mg by mouth every 6 (six) hours as needed for headache.    Yes Historical Provider, MD   BP 130/72 mmHg  Pulse 74  Temp(Src) 98.5 F (36.9 C) (Oral)  Resp 24  SpO2 97% Physical Exam  Constitutional: He is oriented to person, place, and time. He appears well-developed and well-nourished.  HENT:  Head: Normocephalic and atraumatic.  Eyes: EOM are normal. Pupils are equal, round, and reactive to light.  Neck: Normal range of motion. Neck supple. No JVD present.  Cardiovascular: Normal rate and regular rhythm.  Exam reveals no gallop and no friction rub.   No murmur heard. Pulmonary/Chest: No respiratory distress. He has wheezes ( Diffuse with prolonged expiration).  Abdominal: He exhibits no distension. There is no tenderness. There is no rebound and no guarding.   Benign abdominal exam  Musculoskeletal: Normal range of motion.  Neurological: He is alert and oriented  to person, place, and time.  Skin: No rash noted. No pallor.  Psychiatric: He has a normal mood and affect. His behavior is normal.  Nursing note and vitals reviewed.   ED Course  Procedures (including critical care time) Labs Review Labs Reviewed - No data to display  Imaging Review Dg Chest 2 View  10/08/2015  CLINICAL DATA:  Mid chest pain  radiating to the back, onset today. Some shortness of breath. EXAM: CHEST  2 VIEW COMPARISON:  05/31/2012 FINDINGS: The cardiomediastinal contours are normal. The lungs are clear. Pulmonary vasculature is normal. No consolidation, pleural effusion, or pneumothorax. No acute osseous abnormalities are seen. IMPRESSION: No acute pulmonary process. Electronically Signed   By: Jeb Levering M.D.   On: 10/08/2015 20:58   I have personally reviewed and evaluated these images and lab results as part of my medical decision-making.   EKG Interpretation   Date/Time:  Wednesday October 08 2015 20:43:52 EST Ventricular Rate:  74 PR Interval:  192 QRS Duration: 100 QT Interval:  388 QTC Calculation: 430 R Axis:   96 Text Interpretation:  Sinus rhythm Borderline right axis deviation ST  elev, probable normal early repol pattern Baseline wander in lead(s) V3  Sinus rhythm ST-t wave abnormality Abnormal ekg Confirmed by Carmin Muskrat  MD [phone]) on 10/08/2015 8:46:28 PM      MDM   Final diagnoses:  Asthma exacerbation  Gastroesophageal reflux disease, esophagitis presence not specified    35 yo M  With a chief complaints of sob.  Wheezing on exam.  Feel it is likely a  Asthma exacerbation.  Chest x-ray is negative for pneumonia. Was given 3 DuoNeb's back-to-back and Decadron with complete resolution of the symptoms. Will have the patient follow-up with his doctor. Given albuterol inhaler.   patient was also complaining of some reflux symptoms as well as constipation. We'll have him try Zantac as well as MiraLAX.  10:42 PM:  I have discussed the diagnosis/risks/treatment options with the patient and family and believe the pt to be eligible for discharge home to follow-up with PCP. We also discussed returning to the ED immediately if new or worsening sx occur. We discussed the sx which are most concerning (e.g., sudden worsening pain, fever, inability to tolerate by mouth) that necessitate  immediate return. Medications administered to the patient during their visit and any new prescriptions provided to the patient are listed below.  Medications given during this visit Medications  aerochamber plus with mask device 1 each (not administered)  ipratropium-albuterol (DUONEB) 0.5-2.5 (3) MG/3ML nebulizer solution 3 mL (3 mLs Nebulization Given 10/08/15 2136)  dexamethasone (DECADRON) tablet 10 mg (10 mg Oral Given 10/08/15 2116)  gi cocktail (Maalox,Lidocaine,Donnatal) (30 mLs Oral Given 10/08/15 2115)  albuterol (PROVENTIL HFA;VENTOLIN HFA) 108 (90 Base) MCG/ACT inhaler 2 puff (2 puffs Inhalation Given 10/08/15 2219)    Discharge Medication List as of 10/08/2015 10:13 PM      The patient appears reasonably screen and/or stabilized for discharge and I doubt any other medical condition or other [facility] requiring further screening, evaluation, or treatment in the ED at this time prior to discharge.      Deno Etienne, DO 10/08/15 2242

## 2015-10-08 NOTE — Discharge Instructions (Signed)
Try zantac twice a day for reflux.  Use your inhaler 4 puffs every four hours while awake.  Take miralax one scoop in 8 oz of a drink of your choice once a day until you have significant bowel movements.  Asthma Attack Prevention While you may not be able to control the fact that you have asthma, you can take actions to prevent asthma attacks. The best way to prevent asthma attacks is to maintain good control of your asthma. You can achieve this by:  Taking your medicines as directed.  Avoiding things that can irritate your airways or make your asthma symptoms worse (asthma triggers).  Keeping track of how well your asthma is controlled and of any changes in your symptoms.  Responding quickly to worsening asthma symptoms (asthma attack).  Seeking emergency care when it is needed. WHAT ARE SOME WAYS TO PREVENT AN ASTHMA ATTACK? Have a Plan Work with your health care provider to create a written plan for managing and treating your asthma attacks (asthma action plan). This plan includes:  A list of your asthma triggers and how you can avoid them.  Information on when medicines should be taken and when their dosages should be changed.  The use of a device that measures how well your lungs are working (peak flow meter). Monitor Your Asthma Use your peak flow meter and record your results in a journal every day. A drop in your peak flow numbers on one or more days may indicate the start of an asthma attack. This can happen even before you start to feel symptoms. You can prevent an asthma attack from getting worse by following the steps in your asthma action plan. Avoid Asthma Triggers Work with your asthma health care provider to find out what your asthma triggers are. This can be done by:  Allergy testing.  Keeping a journal that notes when asthma attacks occur and the factors that may have contributed to them.  Determining if there are other medical conditions that are making your asthma  worse. Once you have determined your asthma triggers, take steps to avoid them. This may include avoiding excessive or prolonged exposure to:  Dust. Have someone dust and vacuum your home for you once or twice a week. Using a high-efficiency particulate arrestance (HEPA) vacuum is best.  Smoke. This includes campfire smoke, forest fire smoke, and secondhand smoke from tobacco products.  Pet dander. Avoid contact with animals that you know you are allergic to.  Allergens from trees, grasses or pollens. Avoid spending a lot of time outdoors when pollen counts are high, and on very windy days.  Very cold, dry, or humid air.  Mold.  Foods that contain high amounts of sulfites.  Strong odors.  Outdoor air pollutants, such as Lexicographer.  Indoor air pollutants, such as aerosol sprays and fumes from household cleaners.  Household pests, including dust mites and cockroaches, and pest droppings.  Certain medicines, including NSAIDs. Always talk to your health care provider before stopping or starting any new medicines. Medicines Take over-the-counter and prescription medicines only as told by your health care provider. Many asthma attacks can be prevented by carefully following your medicine schedule. Taking your medicines correctly is especially important when you cannot avoid certain asthma triggers. Act Quickly If an asthma attack does happen, acting quickly can decrease how severe it is and how long it lasts. Take these steps:   Pay attention to your symptoms. If you are coughing, wheezing, or having difficulty breathing, do  not wait to see if your symptoms go away on their own. Follow your asthma action plan.  If you have followed your asthma action plan and your symptoms are not improving, call your health care provider or seek immediate medical care at the nearest hospital. It is important to note how often you need to use your fast-acting rescue inhaler. If you are using your  rescue inhaler more often, it may mean that your asthma is not under control. Adjusting your asthma treatment plan may help you to prevent future asthma attacks and help you to gain better control of your condition. HOW CAN I PREVENT AN ASTHMA ATTACK WHEN I EXERCISE? Follow advice from your health care provider about whether you should use your fast-acting inhaler before exercising. Many people with asthma experience exercise-induced bronchoconstriction (EIB). This condition often worsens during vigorous exercise in cold, humid, or dry environments. Usually, people with EIB can stay very active by pre-treating with a fast-acting inhaler before exercising.   This information is not intended to replace advice given to you by your health care provider. Make sure you discuss any questions you have with your health care provider.   Document Released: 08/11/2009 Document Revised: 05/14/2015 Document Reviewed: 01/23/2015 Elsevier Interactive Patient Education [employer].

## 2016-04-19 ENCOUNTER — Emergency Department (HOSPITAL_COMMUNITY)
Admission: EM | Admit: 2016-04-19 | Discharge: 2016-04-20 | Disposition: A | Discharge status: Other Acute Inpatient Hospital | Payer: Self-pay | Attending: Emergency Medicine | Admitting: Emergency Medicine

## 2016-04-19 ENCOUNTER — Encounter (HOSPITAL_COMMUNITY): Payer: Self-pay | Admitting: Emergency Medicine

## 2016-04-19 DIAGNOSIS — F10230 Alcohol dependence with withdrawal, uncomplicated: Secondary | ICD-10-CM

## 2016-04-19 DIAGNOSIS — J45909 Unspecified asthma, uncomplicated: Secondary | ICD-10-CM | POA: Insufficient documentation

## 2016-04-19 DIAGNOSIS — Z791 Long term (current) use of non-steroidal anti-inflammatories (NSAID): Secondary | ICD-10-CM | POA: Insufficient documentation

## 2016-04-19 DIAGNOSIS — F17210 Nicotine dependence, cigarettes, uncomplicated: Secondary | ICD-10-CM | POA: Insufficient documentation

## 2016-04-19 DIAGNOSIS — I10 Essential (primary) hypertension: Secondary | ICD-10-CM | POA: Insufficient documentation

## 2016-04-19 DIAGNOSIS — Z79899 Other long term (current) drug therapy: Secondary | ICD-10-CM | POA: Insufficient documentation

## 2016-04-19 DIAGNOSIS — F1020 Alcohol dependence, uncomplicated: Secondary | ICD-10-CM | POA: Diagnosis present

## 2016-04-19 DIAGNOSIS — F1010 Alcohol abuse, uncomplicated: Secondary | ICD-10-CM

## 2016-04-19 DIAGNOSIS — F332 Major depressive disorder, recurrent severe without psychotic features: Secondary | ICD-10-CM | POA: Diagnosis present

## 2016-04-19 DIAGNOSIS — R45851 Suicidal ideations: Secondary | ICD-10-CM

## 2016-04-19 HISTORY — DX: Unspecified asthma, uncomplicated: J45.909

## 2016-04-19 HISTORY — DX: Gastro-esophageal reflux disease without esophagitis: K21.9

## 2016-04-19 LAB — RAPID URINE DRUG SCREEN, HOSP PERFORMED
Amphetamines: NOT DETECTED
BARBITURATES: NOT DETECTED
Benzodiazepines: NOT DETECTED
Cocaine: NOT DETECTED
Opiates: NOT DETECTED
TETRAHYDROCANNABINOL: NOT DETECTED

## 2016-04-19 LAB — COMPREHENSIVE METABOLIC PANEL
ALBUMIN: 4.7 g/dL (ref 3.5–5.0)
ALT: 50 U/L (ref 17–63)
AST: 46 U/L — AB (ref 15–41)
Alkaline Phosphatase: 73 U/L (ref 38–126)
Anion gap: 10 (ref 5–15)
BILIRUBIN TOTAL: 0.7 mg/dL (ref 0.3–1.2)
BUN: 10 mg/dL (ref 6–20)
CHLORIDE: 105 mmol/L (ref 101–111)
CO2: 23 mmol/L (ref 22–32)
CREATININE: 1.13 mg/dL (ref 0.61–1.24)
Calcium: 9.3 mg/dL (ref 8.9–10.3)
GFR calc Af Amer: >60 mL/min (ref >60)
GLUCOSE: 146 mg/dL — AB (ref 65–99)
POTASSIUM: 4 mmol/L (ref 3.5–5.1)
Sodium: 138 mmol/L (ref 135–145)
TOTAL PROTEIN: 8.8 g/dL — AB (ref 6.5–8.1)

## 2016-04-19 LAB — CBC
HEMATOCRIT: 44 % (ref 39.0–52.0)
HEMOGLOBIN: 15.3 g/dL (ref 13.0–17.0)
MCH: 30.4 pg (ref 26.0–34.0)
MCHC: 34.8 g/dL (ref 30.0–36.0)
MCV: 87.3 fL (ref 78.0–100.0)
Platelets: 183 10*3/uL (ref 150–400)
RBC: 5.04 MIL/uL (ref 4.22–5.81)
RDW: 15.1 % (ref 11.5–15.5)
WBC: 4.5 10*3/uL (ref 4.0–10.5)

## 2016-04-19 LAB — ETHANOL: ALCOHOL ETHYL (B): 168 mg/dL — AB (ref <5)

## 2016-04-19 LAB — ACETAMINOPHEN LEVEL

## 2016-04-19 LAB — SALICYLATE LEVEL: Salicylate Lvl: <4 mg/dL (ref 2.8–30.0)

## 2016-04-19 MED ORDER — ONDANSETRON HCL 4 MG PO TABS: 4.0000 mg | ORAL_TABLET | Freq: Three times a day (TID) | ORAL | Status: DC | PRN

## 2016-04-19 MED ORDER — THIAMINE HCL 100 MG/ML IJ SOLN
100.0000 mg | Freq: Every day | INTRAMUSCULAR | Status: DC
Start: 2016-04-19 — End: 2016-04-20

## 2016-04-19 MED ORDER — LORAZEPAM 1 MG PO TABS: 0.0000 mg | ORAL_TABLET | Freq: Two times a day (BID) | ORAL | Status: DC

## 2016-04-19 MED ORDER — VITAMIN B-1 100 MG PO TABS
100.0000 mg | ORAL_TABLET | Freq: Every day | ORAL | Status: DC
  Administered 2016-04-19 – 2016-04-20 (×2): 100 mg via ORAL
  Filled 2016-04-19 (×2): qty 1

## 2016-04-19 MED ORDER — ALBUTEROL SULFATE (2.5 MG/3ML) 0.083% IN NEBU
3.0000 mL | INHALATION_SOLUTION | Freq: Four times a day (QID) | RESPIRATORY_TRACT | Status: DC | PRN
  Administered 2016-04-19: 3 mL via RESPIRATORY_TRACT
  Filled 2016-04-19 (×2): qty 3

## 2016-04-19 MED ORDER — PANTOPRAZOLE SODIUM 40 MG PO TBEC
40.0000 mg | DELAYED_RELEASE_TABLET | Freq: Two times a day (BID) | ORAL | Status: DC
  Administered 2016-04-19 – 2016-04-20 (×3): 40 mg via ORAL
  Filled 2016-04-19 (×3): qty 1

## 2016-04-19 MED ORDER — LORAZEPAM 1 MG PO TABS
0.0000 mg | ORAL_TABLET | Freq: Four times a day (QID) | ORAL | Status: DC
  Administered 2016-04-19 – 2016-04-20 (×3): 1 mg via ORAL
  Filled 2016-04-19 (×3): qty 1

## 2016-04-19 NOTE — ED Notes (Addendum)
Pt. Informed of the policy  of the suicide precautions.   Belongings put in bag. -Fitted hat -Blue jeans -white t- shirt -brown boots  Patient belonging bags x2

## 2016-04-19 NOTE — ED Notes (Signed)
Pt oriented to room and unit. Pt is pleasant and cooperative.  Wife is at bedside. 15 minute checks and video monitoring in place.

## 2016-04-19 NOTE — Progress Notes (Addendum)
Patient was recommended psychiatric inpatient treatment on 04/19/16, per [name].  Referrals have been sent to: [name], [name], [name], [name], [place], [name].  [name], [place] Disposition staff 04/19/2016 11:15 PM

## 2016-04-19 NOTE — ED Notes (Signed)
Bed: [facility] Expected date:  Expected time:  Means of arrival:  Comments: RM 12

## 2016-04-19 NOTE — ED Notes (Signed)
Pt. Is aware of urine sample needed. Stated he didn't feel like he needed to go.

## 2016-04-19 NOTE — ED Notes (Signed)
Sitter at bedside.

## 2016-04-19 NOTE — ED Notes (Signed)
Family at bedside. 

## 2016-04-19 NOTE — BH Assessment (Signed)
Assessment Note  Derek Ingram is an 35 y.o. male that presents this date with thoughts of self harm with a plan to cut his wrist. Patient was released from prison in January 2017 after serving 26 months for multiple DWI's. Patient stated he maintained his sobriety for two months upon release and relapsed soon thereafter. Patient reports daily use of alcohol reporting consuming 4 to 6 12 oz beers and then binging on weekends. Patient states he consumes up to 1 pint daily on weekends along with a case of beer. Patient denies any other SA use. Patient is currently on probation and denies current legal. Patient is time/place oriented and denies any H/I or AVH. Patient has had one prior attempt at harming himself in 2014 reporting a self inflicted laceration to his wrist due to increased depression. Patient was seen at [facility] and released after one day. Patient reports ongoing depression but denies ever being on any MH medications. Patient states his symptoms have "really gotten bad the last few week" reporting increased stress due to relationship issues with his ex-wife, probation fees and continued racing thoughts. Patient stated "I know I am self medicating with alcohol and can't stop". Patient stated the ongoing thoughts of "never getting better" increased his S/I and had a plan this date to cut his wrist after his wife went to work. Patient stated his sister came over unexpectedly and stopped him before he "went through with it" and transported him to [facility]. Patient is tearful and remorseful during the assessment. Patient is accompanied by his current wife Agamveer Dannemiller [phone]. Admission note states: "Patient presents for alcohol abuse and depression with suicidal thoughts. States she's been drinking heavily. He drank a pint of tequila and some beer last night. He's been drinking heavily since March. He's been drinking for years but had been clean for a while when he had recently been in jail. Denies  other substance abuse. States he previously done marijuana but had to stay clean for his parole so he hadn't done it. Also depression. Suicidal thoughts. He's had this depression for a while but had been worse recently due to a previous death of his mother. States he is not around. States he doesn't want to live anymore. He does have previous suicide attempts". Case was staffed with Reita Cliche DNP who recommended inpatient admission as appropriate bed placement is investigated.   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 Asthma   . GERD (gastroesophageal reflux disease)   . Hyperlipemia   . Hypertension     Past Surgical History:  Procedure Laterality Date  . FRACTURE SURGERY     left hand    Family History:  Family History  Problem Relation Age of Onset  . Liver disease Mother   . Coronary artery disease Father     Social History:  reports that he has been smoking Cigars.  He has never used smokeless tobacco. He reports that he drinks alcohol. He reports that he does not use drugs.  Additional Social History:  Alcohol / Drug Use Pain Medications: See MAR Prescriptions: See MAR Over the Counter: See MAR History of alcohol / drug use?: Yes Longest period of sobriety (when/how long): 26 months while in prison Negative Consequences of Use: Legal Withdrawal Symptoms: Agitation, Irritability Substance #1 Name of Substance 1: Alcohol 1 - Age of First Use: 16 1 - Amount (size/oz):  1 pint of  liquor daily and 6 12 oz beers  1 - Frequency: last three days 1 - Duration: Last 6 months 1 - Last Use / Amount: 04/19/16 1 pint liquor  CIWA: CIWA-Ar BP: 166/98 Pulse Rate: 96 Nausea and Vomiting: no nausea and no vomiting Tactile Disturbances: very mild itching, pins and needles, burning or numbness Tremor: two Auditory Disturbances: not present Paroxysmal Sweats: no sweat  visible Visual Disturbances: not present Anxiety: mildly anxious Headache, Fullness in Head: none present Agitation: somewhat more than normal activity Orientation and Clouding of Sensorium: oriented and can do serial additions CIWA-Ar Total: 5 COWS:    Allergies:  Allergies  Allergen Reactions  . Shrimp [Shellfish Allergy] Anaphylaxis    Home Medications:  (Not in a hospital admission)  OB/GYN Status:  No LMP for male patient.  General Assessment Data Location of Assessment: [facility] ED TTS Assessment: In system Is this a Tele or Face-to-Face Assessment?: Face-to-Face Is this an Initial Assessment or a Re-assessment for this encounter?: Initial Assessment Marital status: Married [place] name: na Is patient pregnant?: No Pregnancy Status: No Living Arrangements: Spouse/significant other Can pt return to current living arrangement?: Yes Admission Status: Voluntary Is patient capable of signing voluntary admission?: Yes Referral Source: Self/Family/Friend Insurance type: pt cannot remember  Medical Screening Exam ([place]) Medical Exam completed: Yes  Crisis Care Plan Living Arrangements: Spouse/significant other Legal Guardian: Other: Name of Psychiatrist: none Name of Therapist: none  Education Status Is patient currently in school?: No Current Grade: na Highest grade of school patient has completed: 12 Name of school: na Contact person: na  Risk to self with the past 6 months Suicidal Ideation: Yes-Currently Present Has patient been a risk to self within the past 6 months prior to admission? : Yes Suicidal Intent: Yes-Currently Present Has patient had any suicidal intent within the past 6 months prior to admission? : No Is patient at risk for suicide?: Yes Suicidal Plan?: Yes-Currently Present Has patient had any suicidal plan within the past 6 months prior to admission? : No Specify Current Suicidal Plan: pt stated he was going to cut wrists Access to  Means: Yes Specify Access to Suicidal Means: pt has knife What has been your use of drugs/alcohol within the last 12 months?: current use Previous Attempts/Gestures: Yes How many times?: 1 Other Self Harm Risks: none Triggers for Past Attempts: Unknown Intentional Self Injurious Behavior: None Family Suicide History: No Recent stressful life event(s): Other (Comment) (relationship issues with ex-wife) Persecutory voices/beliefs?: No Depression: Yes Depression Symptoms: Loss of interest in usual pleasures, Feeling worthless/self pity Substance abuse history and/or treatment for substance abuse?: Yes Suicide prevention information given to non-admitted patients: Yes  Risk to Others within the past 6 months Homicidal Ideation: No Does patient have any lifetime risk of violence toward others beyond the six months prior to admission? : No Thoughts of Harm to Others: No Current Homicidal Intent: No Current Homicidal Plan: No Access to Homicidal Means: No Identified Victim: na History of harm to others?: No Assessment of Violence: None Noted Violent Behavior Description: na Does patient have access to weapons?: No Criminal Charges Pending?: No Does patient have a court date: No Is patient on probation?: Yes  Psychosis Hallucinations: None noted Delusions: None noted  Mental Status Report Appearance/Hygiene: In scrubs Eye Contact: Fair Motor Activity: Unremarkable Speech: Logical/coherent Level of Consciousness: Quiet/awake Mood: Depressed Affect: Depressed Anxiety Level: Moderate Thought Processes: Coherent, Relevant Judgement: Unimpaired Orientation: Person, Place, Time Obsessive Compulsive Thoughts/Behaviors: None  Cognitive Functioning  Concentration: Normal Memory: Recent Intact, Remote Intact IQ: Average Insight: Fair Impulse Control: Poor Appetite: Fair Weight Loss: 0 Weight Gain: 0 Sleep: Decreased Total Hours of Sleep: 5 Vegetative Symptoms:  None  ADLScreening [facility] Assessment Services) Patient's cognitive ability adequate to safely complete daily activities?: Yes Patient able to express need for assistance with ADLs?: Yes Independently performs ADLs?: Yes (appropriate for developmental age)  Prior Inpatient Therapy Prior Inpatient Therapy: Yes Prior Therapy Dates: 2014 Prior Therapy Facilty/Provider(s): [facility] Reason for Treatment: SA use  Prior Outpatient Therapy Prior Outpatient Therapy: No Prior Therapy Dates: na Prior Therapy Facilty/Provider(s): na Reason for Treatment: na Does patient have an ACCT team?: No Does patient have Intensive In-House Services?  : No Does patient have [facility] services? : No Does patient have P4CC services?: No  ADL Screening (condition at time of admission) Patient's cognitive ability adequate to safely complete daily activities?: Yes Is the patient deaf or have difficulty hearing?: No Does the patient have difficulty seeing, even when wearing glasses/contacts?: No Does the patient have difficulty concentrating, remembering, or making decisions?: No Patient able to express need for assistance with ADLs?: Yes Does the patient have difficulty dressing or bathing?: No Independently performs ADLs?: Yes (appropriate for developmental age) Does the patient have difficulty walking or climbing stairs?: No Weakness of Legs: None Weakness of Arms/Hands: None  Home Assistive Devices/Equipment Home Assistive Devices/Equipment: None  Therapy Consults (therapy consults require a physician order) PT Evaluation Needed: No OT Evalulation Needed: No SLP Evaluation Needed: No Abuse/Neglect Assessment (Assessment to be complete while patient is alone) Physical Abuse: Denies Verbal Abuse: Denies Sexual Abuse: Denies Exploitation of patient/patient's resources: Denies Self-Neglect: Denies Values / Beliefs Cultural Requests During Hospitalization: None Spiritual Requests During Hospitalization:  None Consults Spiritual Care Consult Needed: No Social Work Consult Needed: No Regulatory affairs officer (For Healthcare) Does patient have an advance directive?: No Would patient like information on creating an advanced directive?: No - patient declined information (pt declined)    Additional Information 1:1 In Past 12 Months?: No CIRT Risk: No Elopement Risk: No Does patient have medical clearance?: Yes     Disposition: Case was staffed with Reita Cliche DNP who recommended inpatient admission as appropriate bed placement is investigated.  Disposition Initial Assessment Completed for this Encounter: Yes Disposition of Patient: Inpatient treatment program Type of inpatient treatment program: Adult  On Site Evaluation by:   Reviewed with Physician:    Mamie Nick 04/19/2016 5:37 PM

## 2016-04-19 NOTE — ED Triage Notes (Addendum)
Pt reports worsening depression with SI since Friday; anniversary of mother/grandmother passing. Pt has superficial laceration to left wrist and forearm. Pt reports beer and liquor intake just prior to arrival. Pt unable to verbalize amount just states "too much." Pt reports drinking binge since Friday.

## 2016-04-19 NOTE — ED Provider Notes (Addendum)
[facility] DEPT Provider Note   CSN: [id number] Arrival date & time: 04/19/16  1234     History   Chief Complaint Chief Complaint  Patient presents with  . Suicidal    HPI Derek Ingram is a 35 y.o. male.  The history is provided by the patient.  Patient presents for alcohol abuse and depression with suicidal thoughts. States she's been drinking heavily. He drank a fifth of tequila and some beer last night. He's been drinking heavily since March. He's been drinking for years but had been clean for a while when he had recently been in jail. Denies other substance abuse. States he previously done marijuana but had to stay clean for his parole so he hadn't done it. Also depression. Suicidal thoughts. He's had this depression for a while but had been worse recently due to a previous death of his mother. States he is not around. States he doesn't want to live anymore. States if he had had done this morning he probably would've used it and shot himself. He does have previous suicide attempts. Patient has had severe arm from his alcoholism with DUIs and problems with his family. Reportedly had some shaking in bed last night. Reportedly lasted all night and shaking mostly in his head and arms. No biting of his tongue. Your urinary incontinence. This does not appear to be a seizure. No previous seizures.   Past Medical History:  Diagnosis Date  . Asthma   . GERD (gastroesophageal reflux disease)   . Hyperlipemia   . Hypertension     There are no active problems to display for this patient.   Past Surgical History:  Procedure Laterality Date  . FRACTURE SURGERY     left hand       Home Medications    Prior to Admission medications   Medication Sig Start Date End Date Taking? Authorizing Provider  ibuprofen (ADVIL,MOTRIN) 200 MG tablet Take 800 mg by mouth every 6 (six) hours as needed for headache, mild pain or moderate pain.   Yes Historical Provider, MD    Family  History Family History  Problem Relation Age of Onset  . Liver disease Mother   . Coronary artery disease Father     Social History Social History  Substance Use Topics  . Smoking status: Current Some Day Smoker    Types: Cigars  . Smokeless tobacco: Never Used  . Alcohol use Yes     Allergies   Shrimp [shellfish allergy]   Review of Systems Review of Systems  Constitutional: Negative for appetite change.  HENT: Negative for dental problem.   Eyes: Negative for redness.  Respiratory: Negative for chest tightness.   Cardiovascular: Negative for chest pain.  Gastrointestinal: Positive for nausea.  Genitourinary: Negative for flank pain.  Skin: Positive for wound.       Accidental scrape to left forearm.  Neurological: Negative for headaches.  Psychiatric/Behavioral: Positive for dysphoric mood and suicidal ideas. Negative for hallucinations.     Physical Exam Updated Vital Signs BP 166/98   Pulse 96   Temp 98.4 F (36.9 C) (Oral)   Resp 18   Ht 5\' 9"  (1.753 m)   Wt 230 lb (104.3 kg)   SpO2 99%   BMI 33.97 kg/m   Physical Exam  Constitutional: He appears well-developed.  HENT:  Head: Atraumatic.  Eyes: Pupils are equal, round, and reactive to light.  Neck: Neck supple.  Cardiovascular: Normal rate.   Pulmonary/Chest: Effort normal.  Abdominal: Soft.  Musculoskeletal: He exhibits no edema.  Neurological: He is alert.  Skin: Skin is warm. Capillary refill takes less than 2 seconds.  Psychiatric:  Patient appears depressed.     ED Treatments / Results  Labs (all labs ordered are listed, but only abnormal results are displayed) Labs Reviewed  COMPREHENSIVE METABOLIC PANEL - Abnormal; Notable for the following:       Result Value   Glucose, Bld 146 (*)    Total Protein 8.8 (*)    AST 46 (*)    All other components within normal limits  ETHANOL - Abnormal; Notable for the following:    Alcohol, Ethyl (B) 168 (*)    All other components within  normal limits  ACETAMINOPHEN LEVEL - Abnormal; Notable for the following:    Acetaminophen (Tylenol), Serum <10 (*)    All other components within normal limits  CBC  URINE RAPID DRUG SCREEN, HOSP PERFORMED  SALICYLATE LEVEL    EKG  EKG Interpretation None       Radiology No results found.  Procedures Procedures (including critical care time)  Medications Ordered in ED Medications  LORazepam (ATIVAN) tablet 0-4 mg (not administered)    Followed by  LORazepam (ATIVAN) tablet 0-4 mg (not administered)  thiamine (VITAMIN B-1) tablet 100 mg ( Oral See Alternative 04/19/16 1633)    Or  thiamine (B-1) injection 100 mg (100 mg Intravenous Not Given 04/19/16 1633)  ondansetron (ZOFRAN) tablet 4 mg (not administered)     Initial Impression / Assessment and Plan / ED Course  I have reviewed the triage vital signs and the nursing notes.  Pertinent labs & imaging results that were available during my care of the patient were reviewed by me and considered in my medical decision making (see chart for details).  Clinical Course    Patient with alcohol abuse and suicidal thoughts. States that he would've shot himself if he had had a gun. Voluntary at this time. Labs reassuring. Medically cleared. To be seen by TTS.  Final Clinical Impressions(s) / ED Diagnoses   Final diagnoses:  Suicidal ideation  Alcohol abuse    New Prescriptions New Prescriptions   No medications on file     Davonna Belling, MD 04/19/16 [place], MD 04/19/16 1711

## 2016-04-20 DIAGNOSIS — F329 Major depressive disorder, single episode, unspecified: Secondary | ICD-10-CM | POA: Insufficient documentation

## 2016-04-20 DIAGNOSIS — F332 Major depressive disorder, recurrent severe without psychotic features: Secondary | ICD-10-CM | POA: Diagnosis present

## 2016-04-20 DIAGNOSIS — F1020 Alcohol dependence, uncomplicated: Secondary | ICD-10-CM | POA: Diagnosis present

## 2016-04-20 MED ORDER — ALBUTEROL SULFATE HFA 108 (90 BASE) MCG/ACT IN AERS
1.0000 | INHALATION_SPRAY | Freq: Four times a day (QID) | RESPIRATORY_TRACT | Status: DC | PRN
  Administered 2016-04-20: 2 via RESPIRATORY_TRACT

## 2016-04-20 NOTE — ED Notes (Signed)
Pt is calm and cooperative.  Ready for transfer to [facility].  He is pleasant and cooperative.  He shows no withdrawal symptoms.

## 2016-04-20 NOTE — BH Assessment (Addendum)
[facility] Assessment Progress Note  Sharren Bridge, LCSW calls to report that she has spoken to [place] [phone]).  They have agreed to accept pt contingent upon pt being placed under IVC.  This has been staffed with Hampton Abbot, MD, who has initiated IVC.  IVC documents have been faxed to [facility], and at 09:57 Vevelyn Royals confirms receipt.  Service of Findings and Custody Order is pending as of this writing.  This Probation officer has spoken to [place] and she requests that all IVC documents be faxed to [phone]; she recommends that I call first.  Thereafter she will provide the name of the accepting physician.  Dr Dwyane Dee agrees to these terms.  Jalene Mullet, MA Triage Specialist [phone]    Addendum:  IVC documents have been served and they have been faxed to [facility] has confirmed receipt, and reports that pt has been accepted to their facility by Dr Delia Chimes.  She reports that their registration department will call back with a room assignment after pt is registered, following which they will be ready to receive pt and report can be called to [phone].  Waylan Boga, DNP concurs with this decision.  Pt's nurse, Nena Jordan, has been informed of current status.  Pt is to be transported via [facility]  As of this writing I am awaiting call back from [facility] with pt's room assignment.  Jalene Mullet, MA Triage Specialist [phone]   Addendum:  At 13:16 I called [facility] and spoke to [place].  Pt has been assigned to Rm [id number].  Pt's nurse, Nena Jordan, has been notified.  Jalene Mullet, [place] Triage Specialist [phone]

## 2016-04-20 NOTE — Progress Notes (Signed)
Derek Ingram at [facility] called to inquire if pt still needing inpatient treatment. States referral will be reviewed today.

## 2016-04-20 NOTE — Progress Notes (Signed)
Date: 04/20/16 Time: 1400 Location: [facility]  Group Topic: Wellness  Goal Area(s) Addresses:  Patient will get the opportunity for exercise. Patient will get the opportunity to engage with peers.  Behavioral Response: Engaged  Intervention:  10 plastic cups, 1 sponge ball  Activity: Bowling.  LRT set up the cups like pins.  Each patient gets two tries per turn to knock down the 10 cups.  Each cup knocked down counts as 1 point.  The first person to reach 30 points wins.   Education: Wellness, Dentist.   Education Outcome: Acknowledges education/In group clarification offered/Needs additional education.   Clinical Observations/Feedback: Pt was engaged and bright throughout group.  Pt stated that his son bowls.  Pt also stated he likes basketball.  At the end of group LRT offered pt some word searches which pt was pleased to get.     Victorino Sparrow, LRT/CTRS

## 2016-04-20 NOTE — ED Notes (Signed)
Pt discharged ambulatory with [facility].  Pt was calm and cooperative.  All belongings were sent with patient.

## 2016-04-20 NOTE — Progress Notes (Signed)
D Pt. Has been calm and cooperative this pm.  No signs of distress or discomfort noted at present time.  A Writer offered support and encouragement.  R Pt. Remains safe on the unit.

## 2016-09-14 ENCOUNTER — Emergency Department (HOSPITAL_COMMUNITY)
Admission: EM | Admit: 2016-09-14 | Discharge: 2016-09-14 | Disposition: A | Discharge status: Home / Self Care | Payer: Self-pay | Attending: Emergency Medicine | Admitting: Emergency Medicine

## 2016-09-14 ENCOUNTER — Encounter (HOSPITAL_COMMUNITY): Payer: Self-pay | Admitting: Emergency Medicine

## 2016-09-14 DIAGNOSIS — J45909 Unspecified asthma, uncomplicated: Secondary | ICD-10-CM | POA: Insufficient documentation

## 2016-09-14 DIAGNOSIS — R1010 Upper abdominal pain, unspecified: Secondary | ICD-10-CM

## 2016-09-14 DIAGNOSIS — I10 Essential (primary) hypertension: Secondary | ICD-10-CM | POA: Insufficient documentation

## 2016-09-14 DIAGNOSIS — F17290 Nicotine dependence, other tobacco product, uncomplicated: Secondary | ICD-10-CM | POA: Insufficient documentation

## 2016-09-14 DIAGNOSIS — R1013 Epigastric pain: Secondary | ICD-10-CM | POA: Insufficient documentation

## 2016-09-14 LAB — CBC
HCT: 38.7 % — ABNORMAL LOW (ref 39.0–52.0)
Hemoglobin: 12.7 g/dL — ABNORMAL LOW (ref 13.0–17.0)
MCH: 28.5 pg (ref 26.0–34.0)
MCHC: 32.8 g/dL (ref 30.0–36.0)
MCV: 86.8 fL (ref 78.0–100.0)
Platelets: 192 10*3/uL (ref 150–400)
RBC: 4.46 MIL/uL (ref 4.22–5.81)
RDW: 13.8 % (ref 11.5–15.5)
WBC: 6.4 10*3/uL (ref 4.0–10.5)

## 2016-09-14 LAB — COMPREHENSIVE METABOLIC PANEL
ALBUMIN: 4.2 g/dL (ref 3.5–5.0)
ALT: 18 U/L (ref 17–63)
AST: 24 U/L (ref 15–41)
Alkaline Phosphatase: 63 U/L (ref 38–126)
Anion gap: 7 (ref 5–15)
BILIRUBIN TOTAL: 0.6 mg/dL (ref 0.3–1.2)
BUN: 14 mg/dL (ref 6–20)
CO2: 26 mmol/L (ref 22–32)
Calcium: 9.3 mg/dL (ref 8.9–10.3)
Chloride: 106 mmol/L (ref 101–111)
Creatinine, Ser: 1.09 mg/dL (ref 0.61–1.24)
GFR calc Af Amer: >60 mL/min (ref >60)
GFR calc non Af Amer: >60 mL/min (ref >60)
GLUCOSE: 95 mg/dL (ref 65–99)
POTASSIUM: 4.2 mmol/L (ref 3.5–5.1)
SODIUM: 139 mmol/L (ref 135–145)
TOTAL PROTEIN: 7.5 g/dL (ref 6.5–8.1)

## 2016-09-14 LAB — LIPASE, BLOOD: Lipase: 28 U/L (ref 11–51)

## 2016-09-14 MED ORDER — CEPHALEXIN 250 MG PO CAPS
500.0000 mg | ORAL_CAPSULE | Freq: Two times a day (BID) | ORAL | Status: DC
  Administered 2016-09-14: 500 mg via ORAL
  Filled 2016-09-14: qty 2

## 2016-09-14 MED ORDER — CEPHALEXIN 250 MG PO CAPS: 500.0000 mg | ORAL_CAPSULE | Freq: Two times a day (BID) | ORAL | 0 refills | Status: DC

## 2016-09-14 MED ORDER — PANTOPRAZOLE SODIUM 40 MG PO TBEC: 40.0000 mg | DELAYED_RELEASE_TABLET | Freq: Every day | ORAL | 0 refills | Status: DC

## 2016-09-14 MED ORDER — GI COCKTAIL ~~LOC~~
30.0000 mL | Freq: Once | ORAL | Status: AC
  Administered 2016-09-14: 30 mL via ORAL
  Filled 2016-09-14: qty 30

## 2016-09-14 NOTE — ED Notes (Signed)
Pt stable, ambulatory, states understanding of discharge instructions 

## 2016-09-14 NOTE — ED Provider Notes (Signed)
[facility] DEPT Provider Note   CSN: [id number] Arrival date & time: 09/14/16  1256     History   Chief Complaint Chief Complaint  Patient presents with  . Abdominal Pain    HPI Derek Ingram is a 36 y.o. male.  HPI The patient is a 36 year old gentleman with a past medical history of asthma, hyperlipidemia, hypertension, depression and gastroesophageal reflux disease who presents with a three to four-month history of midepigastric intermittent abdominal pain. He says the pain comes on in episodes that last 20-30 seconds. During these episodes it feels like his stomach is locking up. He says that these episodes are much worse after eating a meal. Associated with this he has nausea but has had no emesis. He also endorses mild diaphoresis.In between episodes his pain resolves. Nothing seems to improve this. There is no radiation of this pain to the back or other abdominal quadrant. He has a history of constipation over the previous several months which has improved with laxative therapy. His past medical history is also significant for NSAID use and alcohol abuse. He is currently denying alcohol, drug or illicit substances. He denies chest pain or shortness of breath. He denies dysuria or polyuria. He denies night sweats, fevers, chills or weight loss.  Past Medical History:  Diagnosis Date  . Asthma   . GERD (gastroesophageal reflux disease)   . Hyperlipemia   . Hypertension     Patient Active Problem List   Diagnosis Date Noted  . Alcohol dependence with uncomplicated withdrawal ([place]) 04/20/2016  . Major depressive disorder, recurrent severe without psychotic features ([place]) 04/20/2016    Past Surgical History:  Procedure Laterality Date  . FRACTURE SURGERY     left hand       Home Medications    Prior to Admission medications   Medication Sig Start Date End Date Taking? Authorizing Provider  cephALEXin (KEFLEX) 250 MG capsule Take 2 capsules (500 mg total) by  mouth 2 (two) times daily. 09/14/16   Ophelia Shoulder, MD  ibuprofen (ADVIL,MOTRIN) 200 MG tablet Take 800 mg by mouth every 6 (six) hours as needed for headache, mild pain or moderate pain.    Historical Provider, MD  pantoprazole (PROTONIX) 40 MG tablet Take 1 tablet (40 mg total) by mouth daily. 09/14/16   Ophelia Shoulder, MD    Family History Family History  Problem Relation Age of Onset  . Liver disease Mother   . Coronary artery disease Father     Social History Social History  Substance Use Topics  . Smoking status: Current Some Day Smoker    Types: Cigars  . Smokeless tobacco: Never Used  . Alcohol use Yes     Allergies   Shrimp [shellfish allergy]   Review of Systems Review of Systems  All other systems reviewed and are negative.    Physical Exam Updated Vital Signs BP 145/97   Pulse (!) 57   Temp 98.2 F (36.8 C) (Oral)   Resp 16   SpO2 100%   Physical Exam  Constitutional: He is oriented to person, place, and time. He appears well-developed and well-nourished.  Obese  HENT:  Head: Normocephalic and atraumatic.  Cardiovascular: Normal rate and regular rhythm.  Exam reveals no gallop and no friction rub.   No murmur heard. Pulmonary/Chest: Effort normal and breath sounds normal. No respiratory distress. He has no wheezes.  Abdominal: Soft. Bowel sounds are normal. He exhibits no distension. There is no tenderness.  Musculoskeletal: He exhibits no edema.  Neurological: He is alert and oriented to person, place, and time.     ED Treatments / Results  Labs (all labs ordered are listed, but only abnormal results are displayed) Labs Reviewed  CBC - Abnormal; Notable for the following:       Result Value   Hemoglobin 12.7 (*)    HCT 38.7 (*)    All other components within normal limits  LIPASE, BLOOD  COMPREHENSIVE METABOLIC PANEL    EKG  EKG Interpretation None       Radiology No results found.  Procedures Procedures (including critical care  time)  Medications Ordered in ED Medications  cephALEXin (KEFLEX) capsule 500 mg (500 mg Oral Given 09/14/16 1935)  gi cocktail (Maalox,Lidocaine,Donnatal) (30 mLs Oral Given 09/14/16 1856)     Initial Impression / Assessment and Plan / ED Course  I have reviewed the triage vital signs and the nursing notes.  Pertinent labs & imaging results that were available during my care of the patient were reviewed by me and considered in my medical decision making (see chart for details).  Clinical Course     The patient presents with a 3-4 month history of intermittent, sharp midepigastric abdominal pain. Initial laboratory evaluation in the emergency department is unremarkable. Lipase is within normal limits. The differential diagnosis for this includes gastro-esophageal reflux disease, esophageal dysmotility, peptic ulcer disease (gastric vs. Duodenal), stercoral colitis or other abdominal pathology. However, given the patient's symptoms and association with food intake I think the most likely etiology of the patient's abdominal pain is secondary to peptic ulcer disease. For now we'll give the patient a GI cocktail and prescribe pantoprazole. It will be important that he follows up with his primary care physician in the outpatient setting for further evaluation and management. He'll most likely benefit from an EGD with biopsy for pathology and testing for Helicobacter pylori.The patient also had a splinter removed that was quite large under his nail. He will be discharged on a seven-day course of Keflex to prevent any potential infection.  Final Clinical Impressions(s) / ED Diagnoses   Final diagnoses:  Pain of upper abdomen    New Prescriptions Current Discharge Medication List    START taking these medications   Details  cephALEXin (KEFLEX) 250 MG capsule Take 2 capsules (500 mg total) by mouth 2 (two) times daily. Qty: 28 capsule, Refills: 0    pantoprazole (PROTONIX) 40 MG tablet Take 1  tablet (40 mg total) by mouth daily. Qty: 90 tablet, Refills: 0         Ophelia Shoulder, MD 09/14/16 1936    Margette Fast, MD 09/15/16 [phone]

## 2016-09-14 NOTE — Discharge Instructions (Signed)
I think the most likely cause of your pain is peptic ulcer disease. I will start you on a medication called Protonix which you will need to take once a day every day. It will be important that you follow-up with a primary care provider so they can continue to manage this problem with you and ensure this is the correct diagnosis. I have also attached information regarding this potential diagnosis that you can read. It's important to avoid spicy foods, acidic foods and alcohol. Also, please avoid any nonsteroidal anti-inflammatory drugs such as ibuprofen or Advil. If you have pain he should use Tylenol.  Again it will be important that you follow up with a primary care physician so they can decide if you need to see a gastroenterologist to further help with this pain.

## 2016-09-14 NOTE — ED Triage Notes (Signed)
Pt sts abd pain and nausea x 3 months; pt also has splinter in right middle finger

## 2017-01-24 ENCOUNTER — Emergency Department (HOSPITAL_COMMUNITY)
Admission: EM | Admit: 2017-01-24 | Discharge: 2017-01-24 | Disposition: A | Discharge status: Home / Self Care | Payer: Commercial Managed Care - PPO | Attending: Emergency Medicine | Admitting: Emergency Medicine

## 2017-01-24 ENCOUNTER — Encounter (HOSPITAL_COMMUNITY): Payer: Self-pay | Admitting: Emergency Medicine

## 2017-01-24 DIAGNOSIS — K625 Hemorrhage of anus and rectum: Secondary | ICD-10-CM

## 2017-01-24 DIAGNOSIS — K648 Other hemorrhoids: Secondary | ICD-10-CM | POA: Diagnosis not present

## 2017-01-24 DIAGNOSIS — I10 Essential (primary) hypertension: Secondary | ICD-10-CM | POA: Insufficient documentation

## 2017-01-24 DIAGNOSIS — J45909 Unspecified asthma, uncomplicated: Secondary | ICD-10-CM | POA: Diagnosis not present

## 2017-01-24 DIAGNOSIS — K5900 Constipation, unspecified: Secondary | ICD-10-CM | POA: Insufficient documentation

## 2017-01-24 DIAGNOSIS — R109 Unspecified abdominal pain: Secondary | ICD-10-CM

## 2017-01-24 DIAGNOSIS — R1084 Generalized abdominal pain: Secondary | ICD-10-CM | POA: Diagnosis present

## 2017-01-24 DIAGNOSIS — F17290 Nicotine dependence, other tobacco product, uncomplicated: Secondary | ICD-10-CM | POA: Diagnosis not present

## 2017-01-24 DIAGNOSIS — Z79899 Other long term (current) drug therapy: Secondary | ICD-10-CM | POA: Diagnosis not present

## 2017-01-24 LAB — CBC
HCT: 38.4 % — ABNORMAL LOW (ref 39.0–52.0)
HEMOGLOBIN: 12.4 g/dL — AB (ref 13.0–17.0)
MCH: 28.2 pg (ref 26.0–34.0)
MCHC: 32.3 g/dL (ref 30.0–36.0)
MCV: 87.3 fL (ref 78.0–100.0)
Platelets: 215 10*3/uL (ref 150–400)
RBC: 4.4 MIL/uL (ref 4.22–5.81)
RDW: 14.3 % (ref 11.5–15.5)
WBC: 6.5 10*3/uL (ref 4.0–10.5)

## 2017-01-24 LAB — COMPREHENSIVE METABOLIC PANEL
ALBUMIN: 4.4 g/dL (ref 3.5–5.0)
ALK PHOS: 57 U/L (ref 38–126)
ALT: 15 U/L — ABNORMAL LOW (ref 17–63)
ANION GAP: 7 (ref 5–15)
AST: 18 U/L (ref 15–41)
BUN: 15 mg/dL (ref 6–20)
CO2: 27 mmol/L (ref 22–32)
Calcium: 9.5 mg/dL (ref 8.9–10.3)
Chloride: 105 mmol/L (ref 101–111)
Creatinine, Ser: 1.09 mg/dL (ref 0.61–1.24)
GFR calc Af Amer: >60 mL/min (ref >60)
GFR calc non Af Amer: >60 mL/min (ref >60)
GLUCOSE: 96 mg/dL (ref 65–99)
POTASSIUM: 4.1 mmol/L (ref 3.5–5.1)
SODIUM: 139 mmol/L (ref 135–145)
Total Bilirubin: 0.6 mg/dL (ref 0.3–1.2)
Total Protein: 7.9 g/dL (ref 6.5–8.1)

## 2017-01-24 LAB — POC OCCULT BLOOD, ED: FECAL OCCULT BLD: POSITIVE — AB

## 2017-01-24 LAB — ABO/RH: ABO/RH(D): A POS

## 2017-01-24 LAB — TYPE AND SCREEN
ABO/RH(D): A POS
Antibody Screen: NEGATIVE

## 2017-01-24 LAB — LIPASE, BLOOD: Lipase: 31 U/L (ref 11–51)

## 2017-01-24 NOTE — ED Triage Notes (Signed)
Pt complaint of worsening abdominal pain with nausea and blood in stool over past month. Pt unable to verbalize bright red or dark blood in stool; just verbalizes "I am not sure." Pt has been out of medication for stomach ulcers over past month.

## 2017-01-24 NOTE — ED Provider Notes (Signed)
[facility] DEPT Provider Note   CSN: [id number] Arrival date & time: 01/24/17  1731     History   Chief Complaint Chief Complaint  Patient presents with  . Abdominal Pain  . Blood In Stools  . Nausea    HPI Derek Ingram is a 36 y.o. male.  The history is provided by the patient.  Abdominal Pain   This is a recurrent problem. The current episode started 12 to 24 hours ago (off and on for 1 month; today started about 18 hrs ago). Episode frequency: intermittent. The problem has not changed since onset.Associated with: blood in the stool. The pain is located in the generalized abdominal region. The quality of the pain is sharp and cramping. The pain is moderate. Associated symptoms include hematochezia, melena, nausea and constipation. Pertinent negatives include anorexia, fever, diarrhea and vomiting. The symptoms are aggravated by eating. Nothing relieves the symptoms. His past medical history is significant for PUD. Past medical history comments: hemorrhoids.   No NSAID, iron supplements or Pepto bismol use.   Past Medical History:  Diagnosis Date  . Asthma   . GERD (gastroesophageal reflux disease)   . Hyperlipemia   . Hypertension     Patient Active Problem List   Diagnosis Date Noted  . Alcohol dependence with uncomplicated withdrawal ([place]) 04/20/2016  . Major depressive disorder, recurrent severe without psychotic features ([place]) 04/20/2016    Past Surgical History:  Procedure Laterality Date  . FRACTURE SURGERY     left hand       Home Medications    Prior to Admission medications   Medication Sig Start Date End Date Taking? Authorizing Provider  cephALEXin (KEFLEX) 250 MG capsule Take 2 capsules (500 mg total) by mouth 2 (two) times daily. 09/14/16   Ophelia Shoulder, MD  ibuprofen (ADVIL,MOTRIN) 200 MG tablet Take 800 mg by mouth every 6 (six) hours as needed for headache, mild pain or moderate pain.    [provider]  pantoprazole  (PROTONIX) 40 MG tablet Take 1 tablet (40 mg total) by mouth daily. 09/14/16   Ophelia Shoulder, MD    Family History Family History  Problem Relation Age of Onset  . Liver disease Mother   . Coronary artery disease Father     Social History Social History  Substance Use Topics  . Smoking status: Current Some Day Smoker    Types: Cigars  . Smokeless tobacco: Never Used  . Alcohol use Yes     Allergies   Shrimp [shellfish allergy]   Review of Systems Review of Systems  Constitutional: Negative for fever.  Gastrointestinal: Positive for abdominal pain, constipation, hematochezia, melena and nausea. Negative for anorexia, diarrhea and vomiting.   All other systems are reviewed and are negative for acute change except as noted in the HPI   Physical Exam Updated Vital Signs BP (!) 136/96 (BP Location: Right Arm)   Pulse 63   Temp 98.2 F (36.8 C) (Oral)   Resp 16   SpO2 100%   Physical Exam  Constitutional: He is oriented to person, place, and time. He appears well-developed and well-nourished. No distress.  HENT:  Head: Normocephalic and atraumatic.  Nose: Nose normal.  Eyes: Conjunctivae and EOM are normal. Pupils are equal, round, and reactive to light. Right eye exhibits no discharge. Left eye exhibits no discharge. No scleral icterus.  Neck: Normal range of motion. Neck supple.  Cardiovascular: Normal rate and regular rhythm.  Exam reveals no gallop and no friction rub.  No murmur heard. Pulmonary/Chest: Effort normal and breath sounds normal. No stridor. No respiratory distress. He has no rales.  Abdominal: Soft. He exhibits no distension. There is tenderness (mild discomfort) in the suprapubic area. There is no rigidity, no rebound and no guarding.  Genitourinary: Prostate normal. Rectal exam shows internal hemorrhoid and guaiac positive stool. Rectal exam shows no external hemorrhoid and no fissure.  Genitourinary Comments: Brown stool  Musculoskeletal: He  exhibits no edema or tenderness.  Neurological: He is alert and oriented to person, place, and time.  Skin: Skin is warm and dry. No rash noted. He is not diaphoretic. No erythema.  Psychiatric: He has a normal mood and affect.  Vitals reviewed.    ED Treatments / Results  Labs (all labs ordered are listed, but only abnormal results are displayed) Labs Reviewed  COMPREHENSIVE METABOLIC PANEL - Abnormal; Notable for the following:       Result Value   ALT 15 (*)    All other components within normal limits  CBC - Abnormal; Notable for the following:    Hemoglobin 12.4 (*)    HCT 38.4 (*)    All other components within normal limits  POC OCCULT BLOOD, ED - Abnormal; Notable for the following:    Fecal Occult Bld POSITIVE (*)    All other components within normal limits  LIPASE, BLOOD  URINALYSIS, ROUTINE W REFLEX MICROSCOPIC  TYPE AND SCREEN  ABO/RH    EKG  EKG Interpretation None       Radiology No results found.  Procedures LOWER ENDOSCOPY Date/Time: 01/24/2017 9:00 PM Performed by: Fatima Blank Authorized by: Fatima Blank  Consent: Verbal consent obtained. Consent given by: patient Patient understanding: patient states understanding of the procedure being performed Patient identity confirmed: verbally with patient Time out: Immediately prior to procedure a "time out" was called to verify the correct patient, procedure, equipment, support staff and site/side marked as required. Indications: rectal bleeding  Sedation: Patient sedated: no Scope type: anoscope External exam performed: yes Negative external exam findings: no pilonidal tenderness, no perianal skin tags, no perirectal warts, no perianal maceration, no perianal induration, no perianal erythema and no external hemorrhoids Digital exam performed: yes Positive digital exam findings: occult blood in stool Negative digital exam findings: no laxity of anal sphincter, no prostate  tenderness, no prostate enlargement and no prostate nodules Positive internal exam findings: internal hemorrhoid Procedure termination: procedure complete Patient tolerance: Patient tolerated the procedure well with no immediate complications     (including critical care time)  Medications Ordered in ED Medications - No data to display   Initial Impression / Assessment and Plan / ED Course  I have reviewed the triage vital signs and the nursing notes.  Pertinent labs & imaging results that were available during my care of the patient were reviewed by me and considered in my medical decision making (see chart for details).  Clinical Course as of Jan 25 2127  Mon Jan 24, 2017  2101 Patient with mild suprapubic discomfort on exam. No evidence of peritonitis. Rectal exam and anoscopy without evidence of external hemorrhoids, did reveal large internal hemorrhoids; brown stool. Hemoccult positive. Labs grossly reassuring with stable hemoglobin.  Feel that his symptoms are likely secondary to the internal hemorrhoids and possibly constipation. Discussed symptomatic treatment with diet change and over-the-counter stool softeners.  I have low suspicion for serious intra-abdominal inflammatory/infectious process. Low suspicion for perforated peptic ulcer.  The patient is safe for discharge with strict return  precautions with GI follow up.  [PC]    Clinical Course User Index [PC] Kennet Mccort, Grayce Sessions, MD      Final Clinical Impressions(s) / ED Diagnoses   Final diagnoses:  Rectal bleeding  Constipation, unspecified constipation type  Internal hemorrhoid, bleeding  Abdominal cramping   Disposition: Discharge  Condition: Good  I have discussed the results, Dx and Tx plan with the patient who expressed understanding and agree(s) with the plan. Discharge instructions discussed at great length. The patient was given strict return precautions who verbalized understanding of the  instructions. No further questions at time of discharge.    New Prescriptions   No medications on file    Follow Up: Gastroenterology  Schedule an appointment as soon as possible for a visit    Primary care provider  Call  As needed      Fatima Blank, MD 01/24/17 2128

## 2017-03-18 ENCOUNTER — Emergency Department: Payer: Commercial Managed Care - PPO

## 2017-03-18 ENCOUNTER — Encounter: Payer: Self-pay | Admitting: Emergency Medicine

## 2017-03-18 ENCOUNTER — Emergency Department
Admission: EM | Admit: 2017-03-18 | Discharge: 2017-03-18 | Disposition: A | Discharge status: Home / Self Care | Payer: Commercial Managed Care - PPO | Attending: Emergency Medicine | Admitting: Emergency Medicine

## 2017-03-18 DIAGNOSIS — R4182 Altered mental status, unspecified: Secondary | ICD-10-CM

## 2017-03-18 DIAGNOSIS — J45909 Unspecified asthma, uncomplicated: Secondary | ICD-10-CM | POA: Insufficient documentation

## 2017-03-18 DIAGNOSIS — R825 Elevated urine levels of drugs, medicaments and biological substances: Secondary | ICD-10-CM | POA: Diagnosis not present

## 2017-03-18 DIAGNOSIS — F17290 Nicotine dependence, other tobacco product, uncomplicated: Secondary | ICD-10-CM | POA: Insufficient documentation

## 2017-03-18 DIAGNOSIS — I10 Essential (primary) hypertension: Secondary | ICD-10-CM | POA: Insufficient documentation

## 2017-03-18 DIAGNOSIS — Z79899 Other long term (current) drug therapy: Secondary | ICD-10-CM | POA: Diagnosis not present

## 2017-03-18 LAB — URINE DRUG SCREEN, QUALITATIVE (ARMC ONLY)
AMPHETAMINES, UR SCREEN: NOT DETECTED
Barbiturates, Ur Screen: NOT DETECTED
Benzodiazepine, Ur Scrn: NOT DETECTED
COCAINE METABOLITE, UR ~~LOC~~: NOT DETECTED
Cannabinoid 50 Ng, Ur ~~LOC~~: POSITIVE — AB
MDMA (ECSTASY) UR SCREEN: NOT DETECTED
METHADONE SCREEN, URINE: NOT DETECTED
Opiate, Ur Screen: NOT DETECTED
Phencyclidine (PCP) Ur S: NOT DETECTED
TRICYCLIC, UR SCREEN: NOT DETECTED

## 2017-03-18 LAB — COMPREHENSIVE METABOLIC PANEL
ALBUMIN: 4.6 g/dL (ref 3.5–5.0)
ALT: 22 U/L (ref 17–63)
AST: 29 U/L (ref 15–41)
Alkaline Phosphatase: 62 U/L (ref 38–126)
Anion gap: 9 (ref 5–15)
BILIRUBIN TOTAL: 1.1 mg/dL (ref 0.3–1.2)
BUN: 22 mg/dL — ABNORMAL HIGH (ref 6–20)
CHLORIDE: 104 mmol/L (ref 101–111)
CO2: 25 mmol/L (ref 22–32)
CREATININE: 1.19 mg/dL (ref 0.61–1.24)
Calcium: 9.5 mg/dL (ref 8.9–10.3)
GFR calc Af Amer: >60 mL/min (ref >60)
GLUCOSE: 124 mg/dL — AB (ref 65–99)
Potassium: 3.8 mmol/L (ref 3.5–5.1)
Sodium: 138 mmol/L (ref 135–145)
TOTAL PROTEIN: 8.6 g/dL — AB (ref 6.5–8.1)

## 2017-03-18 LAB — URINALYSIS, COMPLETE (UACMP) WITH MICROSCOPIC
Bacteria, UA: NONE SEEN
Bilirubin Urine: NEGATIVE
Glucose, UA: NEGATIVE mg/dL
HGB URINE DIPSTICK: NEGATIVE
Ketones, ur: NEGATIVE mg/dL
LEUKOCYTES UA: NEGATIVE
Nitrite: NEGATIVE
PROTEIN: 30 mg/dL — AB
SPECIFIC GRAVITY, URINE: 1.032 — AB (ref 1.005–1.030)
pH: 6 (ref 5.0–8.0)

## 2017-03-18 LAB — CBC
HCT: 41.3 % (ref 40.0–52.0)
Hemoglobin: 14 g/dL (ref 13.0–18.0)
MCH: 29.3 pg (ref 26.0–34.0)
MCHC: 34 g/dL (ref 32.0–36.0)
MCV: 86 fL (ref 80.0–100.0)
PLATELETS: 208 10*3/uL (ref 150–440)
RBC: 4.8 MIL/uL (ref 4.40–5.90)
RDW: 14.5 % (ref 11.5–14.5)
WBC: 6.2 10*3/uL (ref 3.8–10.6)

## 2017-03-18 LAB — ETHANOL: Alcohol, Ethyl (B): <5 mg/dL (ref <5)

## 2017-03-18 LAB — TROPONIN I

## 2017-03-18 MED ORDER — SODIUM CHLORIDE 0.9 % IV SOLN
1000.0000 mL | Freq: Once | INTRAVENOUS | Status: AC
Start: 2017-03-18 — End: 2017-03-18
  Administered 2017-03-18: 1000 mL via INTRAVENOUS

## 2017-03-18 NOTE — ED Triage Notes (Signed)
Pt to triage with steady gait, no distress noted. Pt unable to provide information of events. Pts significant other reports pt was shaking in bed, she had a hard time waking pt up, when pt stopped shaking pt was not able to talk normal and pt went outside to brush teeth. Pt sts he went to work this AM and parked in a church parking lot, pts significant other sts none of what he is saying makes since or is true. No medical HX per pt or significant other.

## 2017-03-18 NOTE — ED Provider Notes (Signed)
[facility] Emergency Department Provider Note   ____________________________________________    I have reviewed the triage vital signs and the nursing notes.   HISTORY  Chief Complaint Altered Mental Status     HPI Derek Ingram is a 36 y.o. male who presents because of reported altered mental status this morning. His girlfriend reports that he was difficult to arouse this morning and seemed "jittery". She reports he seemed confused but currently is acting normally. After about 15 minutes his symptoms resolved. Patient does have a history of alcohol dependence but reports he has been sober for 1 year, denies any other drug abuse. Review of records demonstrates seems to happen once before, unclear etiology. Patient feels well now and states that he was just "tired". Denies physical complaints. No neuro deficits.   Past Medical History:  Diagnosis Date  . Asthma   . GERD (gastroesophageal reflux disease)   . Hyperlipemia   . Hypertension     Patient Active Problem List   Diagnosis Date Noted  . Alcohol dependence with uncomplicated withdrawal ([place]) 04/20/2016  . Major depressive disorder, recurrent severe without psychotic features ([place]) 04/20/2016    Past Surgical History:  Procedure Laterality Date  . FRACTURE SURGERY     left hand    Prior to Admission medications   Medication Sig Start Date End Date Taking? Authorizing Provider  albuterol (PROVENTIL HFA;VENTOLIN HFA) 108 (90 Base) MCG/ACT inhaler Inhale 1-2 puffs into the lungs every 6 (six) hours as needed for wheezing or shortness of breath.   Yes [provider]  pantoprazole (PROTONIX) 40 MG tablet Take 1 tablet (40 mg total) by mouth daily. 09/14/16  Yes Ophelia Shoulder, MD  cephALEXin (KEFLEX) 250 MG capsule Take 2 capsules (500 mg total) by mouth 2 (two) times daily. Patient not taking: Reported on 03/18/2017 09/14/16   Ophelia Shoulder, MD  ibuprofen (ADVIL,MOTRIN) 200 MG  tablet Take 800 mg by mouth every 6 (six) hours as needed for headache, mild pain or moderate pain.    [provider]     Allergies Shrimp [shellfish allergy]  Family History  Problem Relation Age of Onset  . Liver disease Mother   . Coronary artery disease Father     Social History Social History  Substance Use Topics  . Smoking status: Current Some Day Smoker    Types: Cigars  . Smokeless tobacco: Never Used  . Alcohol use Yes    Review of Systems  Constitutional: No fever/chills Eyes: No visual changes.  ENT: No sore throat. Cardiovascular: Denies chest pain. Respiratory: Denies shortness of breath. Gastrointestinal: No abdominal pain.  No nausea, no vomiting.   Genitourinary: Negative for dysuria. Musculoskeletal: Negative for back pain. Skin: Negative for rash. Neurological: Negative for headaches    ____________________________________________   PHYSICAL EXAM:  VITAL SIGNS: ED Triage Vitals  Enc Vitals Group     BP 03/18/17 0652 (!) 156/91     Pulse Rate 03/18/17 0652 64     Resp 03/18/17 0652 18     Temp --      Temp src --      SpO2 03/18/17 0652 99 %     Weight 03/18/17 0631 104.3 kg (230 lb)     Height --      Head Circumference --      Peak Flow --      Pain Score --      Pain Loc --      Pain Edu? --  Excl. in [place]? --     Constitutional: Alert and oriented. No acute distress. Pleasant and interactive Eyes: Conjunctivae are normal. PERRLA, EOMI Head: Atraumatic. Nose: No congestion/rhinnorhea. Mouth/Throat: Mucous membranes are moist.   Neck:  Painless ROM, No meningismus  Cardiovascular: Normal rate, regular rhythm. Grossly normal heart sounds.  Good peripheral circulation. Respiratory: Normal respiratory effort.  No retractions. Lungs CTAB. Gastrointestinal: Soft and nontender. No distention.  No CVA tenderness.  Musculoskeletal: No lower extremity tenderness nor edema.  Warm and well perfused Neurologic:  Normal speech  and language. No gross focal neurologic deficits are appreciated. Cranial nerves II through XII are normal. Normal strength in all extremities, no sensory deficits reported Skin:  Skin is warm, dry and intact. No rash noted. Psychiatric: Mood and affect are normal. Speech and behavior are normal.  ____________________________________________   LABS (all labs ordered are listed, but only abnormal results are displayed)  Labs Reviewed  COMPREHENSIVE METABOLIC PANEL - Abnormal; Notable for the following:       Result Value   Glucose, Bld 124 (*)    BUN 22 (*)    Total Protein 8.6 (*)    All other components within normal limits  URINALYSIS, COMPLETE (UACMP) WITH MICROSCOPIC - Abnormal; Notable for the following:    Color, Urine YELLOW (*)    APPearance CLEAR (*)    Specific Gravity, Urine 1.032 (*)    Protein, ur 30 (*)    Squamous Epithelial / LPF 0-5 (*)    All other components within normal limits  URINE DRUG SCREEN, QUALITATIVE ([facility] ONLY) - Abnormal; Notable for the following:    Cannabinoid 50 Ng, Ur Coker POSITIVE (*)    All other components within normal limits  CBC  TROPONIN I  ETHANOL  CBG MONITORING, ED   ____________________________________________  EKG  ED ECG REPORT I, Lavonia Drafts, the attending physician, personally viewed and interpreted this ECG.  Date: 03/18/2017 EKG Time: 6:36 AM Rate: 64 Rhythm: normal sinus rhythm QRS Axis: normal Intervals: normal ST/T Wave abnormalities: normal Narrative Interpretation: unremarkable  ____________________________________________  RADIOLOGY  CT head unremarkable ____________________________________________   PROCEDURES  Procedure(s) performed: No    Critical Care performed: No ____________________________________________   INITIAL IMPRESSION / ASSESSMENT AND PLAN / ED COURSE  Pertinent labs & imaging results that were available during my care of the patient were reviewed by me and considered in my  medical decision making (see chart for details).  Patient well-appearing and in no acute distress. Neuro exam is completely normal. He states he feels well and that he was "just tired". Symptoms do not appear to be consistent with TIA or CVA, he denies relapse of alcohol or drugs. We will check labs, CT head and give IV fluids and reevaluate.   ----------------------------------------- 9:16 AM on 03/18/2017 -----------------------------------------  Patient is well-appearing and has been normal in the ED, no further symptoms. Lab work is unremarkable. Positive cannabis in the urine. Patient is impatient to leave and does not want further evaluation or admission. Given reassuring workup I encouraged outpatient follow-up closely. Return precautions discussed at length.    ____________________________________________   FINAL CLINICAL IMPRESSION(S) / ED DIAGNOSES  Final diagnoses:  Altered mental status, unspecified altered mental status type      NEW MEDICATIONS STARTED DURING THIS VISIT:  New Prescriptions   No medications on file     Note:  This document was prepared using Dragon voice recognition software and may include unintentional dictation errors.    Lavonia Drafts, MD  03/18/17 0918  
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
## 2017-03-27 ENCOUNTER — Emergency Department
Admission: EM | Admit: 2017-03-27 | Discharge: 2017-03-27 | Disposition: A | Discharge status: Home / Self Care | Payer: Commercial Managed Care - PPO | Attending: Emergency Medicine | Admitting: Emergency Medicine

## 2017-03-27 DIAGNOSIS — H00026 Hordeolum internum left eye, unspecified eyelid: Secondary | ICD-10-CM

## 2017-03-27 DIAGNOSIS — J45909 Unspecified asthma, uncomplicated: Secondary | ICD-10-CM | POA: Insufficient documentation

## 2017-03-27 DIAGNOSIS — H0289 Other specified disorders of eyelid: Secondary | ICD-10-CM | POA: Insufficient documentation

## 2017-03-27 DIAGNOSIS — I10 Essential (primary) hypertension: Secondary | ICD-10-CM | POA: Insufficient documentation

## 2017-03-27 DIAGNOSIS — H1032 Unspecified acute conjunctivitis, left eye: Secondary | ICD-10-CM | POA: Diagnosis not present

## 2017-03-27 DIAGNOSIS — F17290 Nicotine dependence, other tobacco product, uncomplicated: Secondary | ICD-10-CM | POA: Insufficient documentation

## 2017-03-27 DIAGNOSIS — H5712 Ocular pain, left eye: Secondary | ICD-10-CM | POA: Diagnosis present

## 2017-03-27 MED ORDER — FLUORESCEIN SODIUM 0.6 MG OP STRP
1.0000 | ORAL_STRIP | Freq: Once | OPHTHALMIC | Status: AC
  Administered 2017-03-27: 1 via OPHTHALMIC
  Filled 2017-03-27: qty 1

## 2017-03-27 MED ORDER — ERYTHROMYCIN 5 MG/GM OP OINT: TOPICAL_OINTMENT | Freq: Three times a day (TID) | OPHTHALMIC | 0 refills | Status: AC

## 2017-03-27 MED ORDER — EYE WASH OPHTH SOLN
1.0000 [drp] | OPHTHALMIC | Status: DC | PRN
  Filled 2017-03-27: qty 118

## 2017-03-27 MED ORDER — TETRACAINE HCL 0.5 % OP SOLN
2.0000 [drp] | Freq: Once | OPHTHALMIC | Status: AC
Start: 2017-03-27 — End: 2017-03-27
  Administered 2017-03-27: 2 [drp] via OPHTHALMIC
  Filled 2017-03-27: qty 4

## 2017-03-27 NOTE — ED Provider Notes (Signed)
[facility] Emergency Department Provider Note   ____________________________________________   I have reviewed the triage vital signs and the nursing notes.   HISTORY  Chief Complaint Eye Injury    HPI Derek Ingram is a 36 y.o. 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 Patient feels he got insulation material in his eyes. He did irrigate his eyes at the eyewash station during the workday however he noted irritation and grittiness following the exposure. Patient also notes inflammation of the upper eyelid. He denies any changes in visual acuity. Patient denies fever, chills, headache, vision changes, chest pain, chest tightness, shortness of breath, abdominal pain, nausea and vomiting.  Past Medical History:  Diagnosis Date  . Asthma   . GERD (gastroesophageal reflux disease)   . Hyperlipemia   . Hypertension     Patient Active Problem List   Diagnosis Date Noted  . Alcohol dependence with uncomplicated withdrawal ([place]) 04/20/2016  . Major depressive disorder, recurrent severe without psychotic features ([place]) 04/20/2016    Past Surgical History:  Procedure Laterality Date  . FRACTURE SURGERY     left hand    Prior to Admission medications   Medication Sig Start Date End Date Taking? Authorizing Provider  albuterol (PROVENTIL HFA;VENTOLIN HFA) 108 (90 Base) MCG/ACT inhaler Inhale 1-2 puffs into the lungs every 6 (six) hours as needed for wheezing or shortness of breath.    [provider]  cephALEXin (KEFLEX) 250 MG capsule Take 2 capsules (500 mg total) by mouth 2 (two) times daily. Patient not taking: Reported on 03/18/2017 09/14/16   Ophelia Shoulder, MD  erythromycin [facility]) ophthalmic ointment Place into the right eye 3 (three) times daily. Place a 1/2 inch ribbon of ointment into the lower eyelid. 03/27/17 04/01/17   Abdi Husak M, PA-C  ibuprofen (ADVIL,MOTRIN) 200 MG tablet Take 800 mg by mouth every 6 (six) hours as needed for headache, mild pain or moderate pain.    [provider]  pantoprazole (PROTONIX) 40 MG tablet Take 1 tablet (40 mg total) by mouth daily. 09/14/16   Ophelia Shoulder, MD    Allergies Shrimp [shellfish allergy]  Family History  Problem Relation Age of Onset  . Liver disease Mother   . Coronary artery disease Father     Social History Social History  Substance Use Topics  . Smoking status: Current Some Day Smoker    Types: Cigars  . Smokeless tobacco: Never Used  . Alcohol use Yes    Review of Systems Constitutional: Negative for fever/chills Eyes: Left eye pain, swelling, redness ENT:  Negative for sore throat and for difficulty swallowing Cardiovascular: Denies chest pain. Respiratory: Denies cough. Denies shortness of breath. Gastrointestinal: No abdominal pain.  No nausea, vomiting, diarrhea. Musculoskeletal: Negative for back pain. Skin: Negative for rash. Neurological: Negative for headaches.  Negative focal weakness or numbness. Negative for loss of consciousness. Able to ambulate. ____________________________________________   PHYSICAL EXAM:  VITAL SIGNS: ED Triage Vitals  Enc Vitals Group     BP 03/27/17 1709 (!) 135/99     Pulse Rate 03/27/17 1707 74     Resp 03/27/17 1707 18     Temp 03/27/17 1707 98.6 F (37 C)     Temp Source 03/27/17 1707 Oral     SpO2 03/27/17 1707 99 %     Weight 03/27/17 1708 230 lb (104.3 kg)  Height 03/27/17 1708 5\' 10"  (1.778 m)     Head Circumference --      Peak Flow --      Pain Score 03/27/17 1707 9     Pain Loc --      Pain Edu? --      Excl. in [place]? --     Constitutional: Alert and oriented. Well appearing and in no acute distress.  Head: Normocephalic and atraumatic. Eyes: Conjunctivae normal right eye. PERRL. Normal extraocular movements. Left eye mild subconjunctival hemorrhage, no tearing or  drainage. Upper eyelid swelling. Nose: No congestion/rhinorrhea/ Mouth/Throat: Mucous membranes are moist. Neck: Supple.  Cardiovascular: Normal rate, regular rhythm. Normal distal pulses. Respiratory: Normal respiratory effort.  Gastrointestinal: Soft and nontender. No distention. Musculoskeletal: Nontender with normal range of motion in all extremities. Neurologic: Normal speech and language.  Skin:  Skin is warm, dry and intact. No rash noted. Psychiatric: Mood and affect are normal.  ____________________________________________   LABS (all labs ordered are listed, but only abnormal results are displayed)  Labs Reviewed - No data to display ____________________________________________  EKG None ____________________________________________  RADIOLOGY None ____________________________________________   PROCEDURES  Procedure(s) performed: Fluorescein stain eye exam performed following physical exam:  Performed by: Jerolyn Shin Authorized by: Jerolyn Shin Consent: Verbal consent obtained. Risks and benefits: risks, benefits and alternatives were discussed Consent given by: patient Irrigated left eye with Eye Stream eye wash.  (1) drop Tetracaine instilled followed by instillation of fluorescein dye with fluorescein strip.  Examination with [place] slit lamp performed:  No defects noted.  Following the exam:  Left eye irrigated with Eye Stream and (1) drop of Tetracaine instilled.   Patient tolerance: Patient tolerated the procedure well with no immediate complications   Critical Care performed: no ____________________________________________   INITIAL IMPRESSION / ASSESSMENT AND PLAN / ED COURSE  Pertinent labs & imaging results that were available during my care of the patient were reviewed by me and considered in my medical decision making (see chart for details).  Patient presents to emergency department with left eye pain, swelling and irritation. History,  physical exam findings, and fluorescein staining are reassuring symptoms consistent with my the meibomian blepharitis secondary to foreign body irritation. Patient will be prescribed a 5 day course erythromycin ophthalmic ointment. Patient advised to follow up with PCP as needed or return to the emergency department if symptoms return or worsen. Patient informed of clinical course, understand medical decision-making process, and agree with plan.  ____________________________________________   FINAL CLINICAL IMPRESSION(S) / ED DIAGNOSES  Final diagnoses:  Acute conjunctivitis of left eye, unspecified acute conjunctivitis type  Meibomian blepharitis, left       NEW MEDICATIONS STARTED DURING THIS VISIT:  Discharge Medication List as of 03/27/2017  6:43 PM    START taking these medications   Details  erythromycin (ROMYCIN) ophthalmic ointment Place into the right eye 3 (three) times daily. Place a 1/2 inch ribbon of ointment into the lower eyelid., Starting Sun 03/27/2017, Until Fri 04/01/2017, Print         Note:  This document was prepared using Dragon voice recognition software and may include unintentional dictation errors.    Karalynn Cottone, Fleet Contras 03/27/17 2231    Carrie Mew, MD 03/27/17 2337

## 2017-03-27 NOTE — ED Triage Notes (Signed)
Pt states that he has been having pain in his L eye since Thursday.  Pt's left eye is irritated and and red with drainage.  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  Pt is A&Ox4, ambulatory to triage.

## 2017-03-27 NOTE — Discharge Instructions (Signed)
Follow antibiotic eye ointment prescription as directed. Continue to monitor to monitor eye for signs of improvement. If he notices worsening of symptoms return to the emergency department.

## 2017-08-24 ENCOUNTER — Encounter: Payer: Self-pay | Admitting: Gastroenterology

## 2017-08-25 ENCOUNTER — Emergency Department: Payer: Commercial Managed Care - PPO

## 2017-08-25 ENCOUNTER — Emergency Department
Admission: EM | Admit: 2017-08-25 | Discharge: 2017-08-25 | Disposition: A | Discharge status: Home / Self Care | Payer: Commercial Managed Care - PPO | Attending: Emergency Medicine | Admitting: Emergency Medicine

## 2017-08-25 DIAGNOSIS — Y92410 Unspecified street and highway as the place of occurrence of the external cause: Secondary | ICD-10-CM | POA: Diagnosis not present

## 2017-08-25 DIAGNOSIS — F17210 Nicotine dependence, cigarettes, uncomplicated: Secondary | ICD-10-CM | POA: Insufficient documentation

## 2017-08-25 DIAGNOSIS — Y9389 Activity, other specified: Secondary | ICD-10-CM | POA: Insufficient documentation

## 2017-08-25 DIAGNOSIS — J45909 Unspecified asthma, uncomplicated: Secondary | ICD-10-CM | POA: Diagnosis not present

## 2017-08-25 DIAGNOSIS — I10 Essential (primary) hypertension: Secondary | ICD-10-CM | POA: Insufficient documentation

## 2017-08-25 DIAGNOSIS — M7918 Myalgia, other site: Secondary | ICD-10-CM | POA: Diagnosis not present

## 2017-08-25 DIAGNOSIS — Z79899 Other long term (current) drug therapy: Secondary | ICD-10-CM | POA: Insufficient documentation

## 2017-08-25 DIAGNOSIS — Y999 Unspecified external cause status: Secondary | ICD-10-CM | POA: Insufficient documentation

## 2017-08-25 MED ORDER — KETOROLAC TROMETHAMINE 30 MG/ML IJ SOLN
30.0000 mg | Freq: Once | INTRAMUSCULAR | Status: AC
  Administered 2017-08-25: 30 mg via INTRAMUSCULAR
  Filled 2017-08-25: qty 1

## 2017-08-25 MED ORDER — CYCLOBENZAPRINE HCL 10 MG PO TABS: 10.0000 mg | ORAL_TABLET | Freq: Three times a day (TID) | ORAL | 0 refills | Status: AC | PRN

## 2017-08-25 MED ORDER — ORPHENADRINE CITRATE 30 MG/ML IJ SOLN
60.0000 mg | Freq: Two times a day (BID) | INTRAMUSCULAR | Status: DC
  Administered 2017-08-25: 60 mg via INTRAMUSCULAR
  Filled 2017-08-25: qty 2

## 2017-08-25 MED ORDER — MELOXICAM 15 MG PO TABS: 15.0000 mg | ORAL_TABLET | Freq: Every day | ORAL | 1 refills | Status: AC

## 2017-08-25 NOTE — ED Notes (Signed)
Patient discharged to home per MD order. Patient in stable condition, and deemed medically cleared by ED provider for discharge. Discharge instructions reviewed with patient/family using "Teach Back"; verbalized understanding of medication education and administration, and information about follow-up care. Denies further concerns. ° °
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
## 2017-08-25 NOTE — ED Triage Notes (Signed)
Patient reports he took 500 mg tylenol at 2045.
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
## 2017-09-09 ENCOUNTER — Encounter: Payer: Self-pay | Admitting: Gastroenterology

## 2017-09-09 ENCOUNTER — Ambulatory Visit (INDEPENDENT_AMBULATORY_CARE_PROVIDER_SITE_OTHER): Payer: Commercial Managed Care - PPO | Admitting: Gastroenterology

## 2017-09-09 VITALS — BP 128/82 | HR 88 | Ht 70.0 in | Wt 220.4 lb

## 2017-09-09 DIAGNOSIS — K219 Gastro-esophageal reflux disease without esophagitis: Secondary | ICD-10-CM | POA: Diagnosis not present

## 2017-09-09 DIAGNOSIS — R634 Abnormal weight loss: Secondary | ICD-10-CM | POA: Diagnosis not present

## 2017-09-09 DIAGNOSIS — K625 Hemorrhage of anus and rectum: Secondary | ICD-10-CM

## 2017-09-09 DIAGNOSIS — R1013 Epigastric pain: Secondary | ICD-10-CM

## 2017-09-09 DIAGNOSIS — R110 Nausea: Secondary | ICD-10-CM

## 2017-09-09 MED ORDER — HYDROCORTISONE ACETATE 25 MG RE SUPP: 25.0000 mg | Freq: Every day | RECTAL | 1 refills | Status: DC

## 2017-09-09 MED ORDER — OMEPRAZOLE 40 MG PO CPDR: 40.0000 mg | DELAYED_RELEASE_CAPSULE | Freq: Two times a day (BID) | ORAL | 3 refills | Status: DC

## 2017-09-09 NOTE — Patient Instructions (Signed)
If you are age 39 or older, your body mass index should be between 23-30. Your Body mass index is 31.62 kg/m. If this is out of the aforementioned range listed, please consider follow up with your Primary Care Provider.  If you are age 39 or younger, your body mass index should be between 19-25. Your Body mass index is 31.62 kg/m. If this is out of the aformentioned range listed, please consider follow up with your Primary Care Provider.   We have sent the following medications to your pharmacy for you to pick up at your convenience:  Omeprazole  Hyrdrocortisone Suppository  You have been scheduled for an endoscopy. Please follow written instructions given to you at your visit today. If you use inhalers (even only as needed), please bring them with you on the day of your procedure. Your physician has requested that you go to www.startemmi.com and enter the access code given to you at your visit today. This web site gives a general overview about your procedure. However, you should still follow specific instructions given to you by our office regarding your preparation for the procedure.  Thank you.

## 2017-09-09 NOTE — Progress Notes (Signed)
09/09/2017 Derek Ingram [id number] [birth year]   HISTORY OF PRESENT ILLNESS: This is a 37 year old male who is new to our practice.  He is here today as a self-referral.  He comes in today with several complaints.  First and primarily, he complains of abdominal pain in his upper abdomen.  Describes it is a lot of churning, cramping, burning.  Says it hurts even with the bite of food.  He admits to nausea, but no vomiting.  He says that he has lost about 20 pounds.  He does admit to heartburn/reflux.  Denies NSAID use.  He denies black stools.  Has not been on any medication for his symptoms.  He thinks that he may have an ulcer.  He also complains of intermittent bright red rectal bleeding.  He is told that he had an internal hemorrhoid.  Has not experienced any bleeding in   He denies any issues with moving his bowels, constipation or diarrhea.  All of these issues have been present at least since May, when he was initially seen in the ED.  At that time an anoscopy was performed by the EDP and they reported an internal hemorrhoid, but he was not given anything for it (or any of his other GI symptoms) and was told to follow-up with GI.  CMP and lipase were normal at that time.  CBC showed a mildly low Hgb at 12.4 grams, but since then it has normalized to 14 grams.   Past Medical History:  Diagnosis Date  . Asthma   . GERD (gastroesophageal reflux disease)   . Hyperlipemia   . Hypertension    Past Surgical History:  Procedure Laterality Date  . FRACTURE SURGERY     left hand    reports that he has been smoking cigars.  he has never used smokeless tobacco. He reports that he drinks alcohol. He reports that he does not use drugs. family history includes Coronary artery disease in his father; Liver disease in his mother. Allergies  Allergen Reactions  . Shrimp [Shellfish Allergy] Anaphylaxis      Outpatient Encounter Medications as of 09/09/2017  Medication Sig  . albuterol  (PROVENTIL HFA;VENTOLIN HFA) 108 (90 Base) MCG/ACT inhaler Inhale 1-2 puffs into the lungs every 6 (six) hours as needed for wheezing or shortness of breath.  Marland Kitchen ibuprofen (ADVIL,MOTRIN) 200 MG tablet Take 800 mg by mouth every 6 (six) hours as needed for headache, mild pain or moderate pain.  . [DISCONTINUED] cephALEXin (KEFLEX) 250 MG capsule Take 2 capsules (500 mg total) by mouth 2 (two) times daily. (Patient not taking: Reported on 09/09/2017)  . [DISCONTINUED] pantoprazole (PROTONIX) 40 MG tablet Take 1 tablet (40 mg total) by mouth daily. (Patient not taking: Reported on 09/09/2017)   No facility-administered encounter medications on file as of 09/09/2017.      REVIEW OF SYSTEMS  : All other systems reviewed and negative except where noted in the History of Present Illness.   PHYSICAL EXAM: BP 128/82   Pulse 88   Ht 5\' 10"  (1.778 m)   Wt 220 lb 6.4 oz (100 kg)   SpO2 98%   BMI 31.62 kg/m  General: Well developed black male in no acute distress Head: Normocephalic and atraumatic Eyes:  Sclerae anicteric, conjunctiva pink. Ears: Normal auditory acuity Lungs: Clear throughout to auscultation; no increased WOB. Heart: Regular rate and rhythm; no M/R/G. Abdomen: Soft, non-distended.  Normal bowel sounds.  Mild upper abdominal TTP. Rectal:  Will be done  at the time of colonoscopy. Musculoskeletal: Symmetrical with no gross deformities  Skin: No lesions on visible extremities Extremities: No edema  Neurological: Alert oriented x 4, grossly non-focal Psychological:  Alert and cooperative. Normal mood and affect  ASSESSMENT AND PLAN: *Epigastric abdominal pain, nausea, GERD, weight loss:  Will place him on omeprazole 40 mg daily, but will also schedule him for EGD with Dr. Fuller Plan. *Rectal bleeding:  Intermittent.  Sounds like outlet bleeding from hemorrhoids.  Patient is concerned and would like colonoscopy especially in light of the weight loss.  In the interim he can try hydrocortisone  suppositories as needed.  **The risks, benefits, and alternatives to EGD and colonoscopy were discussed with the patient and he consents to proceed.   CC:  No ref. provider found

## 2017-09-13 ENCOUNTER — Encounter: Payer: Self-pay | Admitting: Gastroenterology

## 2017-09-13 ENCOUNTER — Telehealth: Payer: Self-pay | Admitting: Gastroenterology

## 2017-09-13 DIAGNOSIS — K219 Gastro-esophageal reflux disease without esophagitis: Secondary | ICD-10-CM | POA: Insufficient documentation

## 2017-09-13 DIAGNOSIS — R1013 Epigastric pain: Secondary | ICD-10-CM | POA: Insufficient documentation

## 2017-09-13 DIAGNOSIS — K625 Hemorrhage of anus and rectum: Secondary | ICD-10-CM | POA: Insufficient documentation

## 2017-09-13 DIAGNOSIS — R110 Nausea: Secondary | ICD-10-CM | POA: Insufficient documentation

## 2017-09-13 NOTE — Telephone Encounter (Signed)
I really think that his bleeding is hemorrhoidal but because of his weight loss go ahead and add him on for colonoscopy as well.  Thank you,  Jess

## 2017-09-13 NOTE — Telephone Encounter (Signed)
Jessica please advise

## 2017-09-14 DIAGNOSIS — R634 Abnormal weight loss: Secondary | ICD-10-CM | POA: Insufficient documentation

## 2017-09-14 NOTE — Telephone Encounter (Signed)
Left message on machine to call back  

## 2017-09-14 NOTE — Progress Notes (Signed)
Reviewed and agree with management plan.  Malcolm T. Stark, MD FACG 

## 2017-09-16 NOTE — Telephone Encounter (Signed)
Voicemail full.

## 2017-09-19 NOTE — Telephone Encounter (Signed)
The pt will keep the EGD as scheduled and wants to have colon at a later time. He was scheduled for pre visit and colon for 1/28 and 2/4

## 2017-09-20 ENCOUNTER — Ambulatory Visit (AMBULATORY_SURGERY_CENTER): Payer: Commercial Managed Care - PPO | Admitting: Gastroenterology

## 2017-09-20 ENCOUNTER — Encounter: Payer: Self-pay | Admitting: Gastroenterology

## 2017-09-20 VITALS — BP 125/79 | HR 52 | Temp 98.6°F | Resp 13 | Ht 70.0 in | Wt 220.0 lb

## 2017-09-20 DIAGNOSIS — K2100 Gastro-esophageal reflux disease with esophagitis, without bleeding: Secondary | ICD-10-CM

## 2017-09-20 DIAGNOSIS — R1013 Epigastric pain: Secondary | ICD-10-CM

## 2017-09-20 MED ORDER — SODIUM CHLORIDE 0.9 % IV SOLN: 500.0000 mL | Freq: Once | INTRAVENOUS | Status: DC

## 2017-09-20 MED ORDER — DICYCLOMINE HCL 10 MG PO CAPS: 10.0000 mg | ORAL_CAPSULE | Freq: Three times a day (TID) | ORAL | 11 refills | Status: DC

## 2017-09-20 NOTE — Op Note (Signed)
[facility] Patient Name: Derek Ingram Procedure Date: 09/20/2017 3:00 PM MRN: [id number] Endoscopist: Ladene Artist , MD Age: 37 Referring MD:  Date of Birth: Jul 23, 1981 Gender: Male Account #: [id number] Procedure:                Upper GI endoscopy Indications:              Epigastric abdominal pain, Gastroesophageal reflux                            disease Medicines:                Monitored Anesthesia Care Procedure:                Pre-Anesthesia Assessment:                           - Prior to the procedure, a History and Physical                            was performed, and patient medications and                            allergies were reviewed. The patient's tolerance of                            previous anesthesia was also reviewed. The risks                            and benefits of the procedure and the sedation                            options and risks were discussed with the patient.                            All questions were answered, and informed consent                            was obtained. Prior Anticoagulants: The patient has                            taken no previous anticoagulant or antiplatelet                            agents. ASA Grade Assessment: II - A patient with                            mild systemic disease. After reviewing the risks                            and benefits, the patient was deemed in                            satisfactory condition to undergo the procedure.  After obtaining informed consent, the endoscope was                            passed under direct vision. Throughout the                            procedure, the patient's blood pressure, pulse, and                            oxygen saturations were monitored continuously. The                            Endoscope was introduced through the mouth, and                            advanced to the second part of duodenum. The  upper                            GI endoscopy was accomplished without difficulty.                            The patient tolerated the procedure well. Scope In: Scope Out: Findings:                 LA Grade B (one or more mucosal breaks greater than                            5 mm, not extending between the tops of two mucosal                            folds) esophagitis with no bleeding was found in                            the distal esophagus.                           The exam of the esophagus was otherwise normal.                           The entire examined stomach was normal.                           The duodenal bulb and second portion of the                            duodenum were normal. Complications:            No immediate complications. Estimated Blood Loss:     Estimated blood loss was minimal. Impression:               - LA Grade B reflux esophagitis.                           - Normal stomach.                           -  Normal duodenal bulb and second portion of the                            duodenum.                           - No specimens collected. Recommendation:           - Patient has a contact number available for                            emergencies. The signs and symptoms of potential                            delayed complications were discussed with the                            patient. Return to normal activities tomorrow.                            Written discharge instructions were provided to the                            patient.                           - Resume previous diet.                           - Antireflux measures.                           - Continue present medications.                           - Dicyclomine 10 mg po tid ac, 1 year of refills.                           - GI office visit in 1 month. Ladene Artist, MD 09/20/2017 3:16:08 PM This report has been signed electronically.

## 2017-09-20 NOTE — Progress Notes (Signed)
Report to PACU, RN, vss, BBS= Clear.  

## 2017-09-20 NOTE — Progress Notes (Signed)
Pt's states no medical or surgical changes since previsit or office visit. 

## 2017-09-20 NOTE — Patient Instructions (Signed)
YOU HAD AN ENDOSCOPIC PROCEDURE TODAY AT [facility]:   Refer to the procedure report that was given to you for any specific questions about what was found during the examination.  If the procedure report does not answer your questions, please call your gastroenterologist to clarify.  If you requested that your care partner not be given the details of your procedure findings, then the procedure report has been included in a sealed envelope for you to review at your convenience later.  YOU SHOULD EXPECT: Some feelings of bloating in the abdomen. Passage of more gas than usual.  Walking can help get rid of the air that was put into your GI tract during the procedure and reduce the bloating. If you had a lower endoscopy (such as a colonoscopy or flexible sigmoidoscopy) you may notice spotting of blood in your stool or on the toilet paper. If you underwent a bowel prep for your procedure, you may not have a normal bowel movement for a few days.  Please Note:  You might notice some irritation and congestion in your nose or some drainage.  This is from the oxygen used during your procedure.  There is no need for concern and it should clear up in a day or so.  SYMPTOMS TO REPORT IMMEDIATELY:   Following upper endoscopy (EGD)  Vomiting of blood or coffee ground material  New chest pain or pain under the shoulder blades  Painful or persistently difficult swallowing  New shortness of breath  Fever of 100F or higher  Black, tarry-looking stools  For urgent or emergent issues, a gastroenterologist can be reached at any hour by calling [phone].  Please see information provided on esophagitis.  DIET:  We do recommend a small meal at first, but then you may proceed to your regular diet.  Drink plenty of fluids but you should avoid alcoholic beverages for 24 hours.  ACTIVITY:  You should plan to take it easy for the rest of today and you should NOT DRIVE or use heavy machinery until  tomorrow (because of the sedation medicines used during the test).    FOLLOW UP: Our staff will call the number listed on your records the next business day following your procedure to check on you and address any questions or concerns that you may have regarding the information given to you following your procedure. If we do not reach you, we will leave a message.  However, if you are feeling well and you are not experiencing any problems, there is no need to return our call.  We will assume that you have returned to your regular daily activities without incident.  If any biopsies were taken you will be contacted by phone or by letter within the next 1-3 weeks.  Please call us at [phone] if you have not heard about the biopsies in 3 weeks.    SIGNATURES/CONFIDENTIALITY: You and/or your care partner have signed paperwork which will be entered into your electronic medical record.  These signatures attest to the fact that that the information above on your After Visit Summary has been reviewed and is understood.  Full responsibility of the confidentiality of this discharge information lies with you and/or your care-partner.  Thank you for allowing to provide your healthcare today.

## 2017-09-21 ENCOUNTER — Telehealth: Payer: Self-pay | Admitting: *Deleted

## 2017-09-21 ENCOUNTER — Telehealth: Payer: Self-pay

## 2017-09-21 NOTE — Telephone Encounter (Signed)
  Follow up Call-  Call back number 09/20/2017  Post procedure Call Back phone  # [phone]  Permission to leave phone message Yes  Some recent data might be hidden    Mailbox is full, not able to leave a message

## 2017-09-21 NOTE — Telephone Encounter (Signed)
Attempted to reach patient for post-procedure f/u call. No answer and no answering machine, voicemail. Will attempt to reach him again later today.

## 2017-10-03 ENCOUNTER — Ambulatory Visit (AMBULATORY_SURGERY_CENTER): Payer: Self-pay | Admitting: *Deleted

## 2017-10-03 ENCOUNTER — Other Ambulatory Visit: Payer: Self-pay

## 2017-10-03 VITALS — Ht 70.0 in | Wt 224.0 lb

## 2017-10-03 DIAGNOSIS — K625 Hemorrhage of anus and rectum: Secondary | ICD-10-CM

## 2017-10-03 MED ORDER — NA SULFATE-K SULFATE-MG SULF 17.5-3.13-1.6 GM/177ML PO SOLN: ORAL | 0 refills | Status: DC

## 2017-10-03 NOTE — Progress Notes (Signed)
Patient denies any allergies to eggs or soy. Patient denies any problems with anesthesia/sedation. Patient denies any oxygen use at home. Patient denies taking any diet/weight loss medications or blood thinners. EMMI education assisgned to patient on colonoscopy, this was explained and instructions given to patient. 

## 2017-10-04 ENCOUNTER — Encounter: Payer: Self-pay | Admitting: Gastroenterology

## 2017-10-06 ENCOUNTER — Telehealth: Payer: Self-pay | Admitting: Gastroenterology

## 2017-10-06 NOTE — Telephone Encounter (Signed)
The pt has been advised and will go to the ED if bleeding becomes heavy, otherwise he will keep the appt as scheduled.

## 2017-10-06 NOTE — Telephone Encounter (Signed)
If bleeding continues in large amounts then he needs to go to the ED but otherwise needs to prep for his colonoscopy on Monday.

## 2017-10-06 NOTE — Telephone Encounter (Signed)
The pt has a BM and noticed the toilet was full of BRB.  He said it was the one episode this morning and will watch the next BM for further bleeding.  NO pain, he is scheduled for colon on Monday with Dr stark.  Is it ok for him to do the prep?  He is worried it will be to harsh?  Please advise

## 2017-10-10 ENCOUNTER — Encounter: Payer: Self-pay | Admitting: Gastroenterology

## 2017-10-10 ENCOUNTER — Ambulatory Visit (AMBULATORY_SURGERY_CENTER): Payer: Commercial Managed Care - PPO | Admitting: Gastroenterology

## 2017-10-10 VITALS — BP 111/68 | HR 57 | Temp 98.0°F | Resp 16 | Ht 70.0 in | Wt 224.0 lb

## 2017-10-10 DIAGNOSIS — K635 Polyp of colon: Secondary | ICD-10-CM | POA: Diagnosis not present

## 2017-10-10 DIAGNOSIS — K5140 Inflammatory polyps of colon without complications: Secondary | ICD-10-CM | POA: Diagnosis not present

## 2017-10-10 DIAGNOSIS — D120 Benign neoplasm of cecum: Secondary | ICD-10-CM

## 2017-10-10 DIAGNOSIS — K921 Melena: Secondary | ICD-10-CM | POA: Diagnosis not present

## 2017-10-10 MED ORDER — SODIUM CHLORIDE 0.9 % IV SOLN: 500.0000 mL | Freq: Once | INTRAVENOUS | Status: DC

## 2017-10-10 NOTE — Op Note (Signed)
[facility] Patient Name: Derek Ingram Procedure Date: 10/10/2017 10:33 AM MRN: [id number] Endoscopist: Ladene Artist , MD Age: 37 Referring MD:  Date of Birth: 05/31/1981 Gender: Male Account #: [id number] Procedure:                Colonoscopy Indications:              Hematochezia Medicines:                Monitored Anesthesia Care Procedure:                Pre-Anesthesia Assessment:                           - Prior to the procedure, a History and Physical                            was performed, and patient medications and                            allergies were reviewed. The patient's tolerance of                            previous anesthesia was also reviewed. The risks                            and benefits of the procedure and the sedation                            options and risks were discussed with the patient.                            All questions were answered, and informed consent                            was obtained. Prior Anticoagulants: The patient has                            taken no previous anticoagulant or antiplatelet                            agents. ASA Grade Assessment: II - A patient with                            mild systemic disease. After reviewing the risks                            and benefits, the patient was deemed in                            satisfactory condition to undergo the procedure.                           After obtaining informed consent, the colonoscope  was passed under direct vision. Throughout the                            procedure, the patient's blood pressure, pulse, and                            oxygen saturations were monitored continuously. The                            Colonoscope was introduced through the anus and                            advanced to the the cecum, identified by                            appendiceal orifice and ileocecal valve. The                  ileocecal valve, appendiceal orifice, and rectum                            were photographed. The quality of the bowel                            preparation was excellent. The colonoscopy was                            performed without difficulty. The patient tolerated                            the procedure well. Scope In: 10:44:03 AM Scope Out: 10:53:35 AM Scope Withdrawal Time: 0 hours 7 minutes 49 seconds  Total Procedure Duration: 0 hours 9 minutes 32 seconds  Findings:                 The perianal and digital rectal examinations were                            normal.                           A 7 mm polyp was found in the ileocecal valve. The                            polyp was semi-pedunculated. The polyp was removed                            with a cold snare. Resection and retrieval were                            complete.                           Internal hemorrhoids were found during  retroflexion. The hemorrhoids were small and Grade                            I (internal hemorrhoids that do not prolapse).                           The exam was otherwise without abnormality on                            direct and retroflexion views. Complications:            No immediate complications. Estimated blood loss:                            None. Estimated Blood Loss:     Estimated blood loss: none. Impression:               - One 7 mm polyp at the ileocecal valve, removed                            with a cold snare. Resected and retrieved.                           - Internal hemorrhoids.                           - The examination was otherwise normal on direct                            and retroflexion views. Recommendation:           - Repeat colonoscopy in 5 years for surveillance if                            polyp is precancerous otherwise routine screening                            at age 78-50.                            - Patient has a contact number available for                            emergencies. The signs and symptoms of potential                            delayed complications were discussed with the                            patient. Return to normal activities tomorrow.                            Written discharge instructions were provided to the                            patient.                           -  Resume previous diet.                           - Continue present medications.                           - Await pathology results. Ladene Artist, MD 10/10/2017 10:56:34 AM This report has been signed electronically.

## 2017-10-10 NOTE — Patient Instructions (Signed)
*  Handouts given to patient on hemorrhoids and polyps*  YOU HAD AN ENDOSCOPIC PROCEDURE TODAY AT [facility]:   Refer to the procedure report that was given to you for any specific questions about what was found during the examination.  If the procedure report does not answer your questions, please call your gastroenterologist to clarify.  If you requested that your care partner not be given the details of your procedure findings, then the procedure report has been included in a sealed envelope for you to review at your convenience later.  YOU SHOULD EXPECT: Some feelings of bloating in the abdomen. Passage of more gas than usual.  Walking can help get rid of the air that was put into your GI tract during the procedure and reduce the bloating. If you had a lower endoscopy (such as a colonoscopy or flexible sigmoidoscopy) you may notice spotting of blood in your stool or on the toilet paper. If you underwent a bowel prep for your procedure, you may not have a normal bowel movement for a few days.  Please Note:  You might notice some irritation and congestion in your nose or some drainage.  This is from the oxygen used during your procedure.  There is no need for concern and it should clear up in a day or so.  SYMPTOMS TO REPORT IMMEDIATELY:   Following lower endoscopy (colonoscopy or flexible sigmoidoscopy):  Excessive amounts of blood in the stool  Significant tenderness or worsening of abdominal pains  Swelling of the abdomen that is new, acute  Fever of 100F or higher   For urgent or emergent issues, a gastroenterologist can be reached at any hour by calling [phone].   DIET:  We do recommend a small meal at first, but then you may proceed to your regular diet.  Drink plenty of fluids but you should avoid alcoholic beverages for 24 hours.  ACTIVITY:  You should plan to take it easy for the rest of today and you should NOT DRIVE or use heavy machinery until tomorrow  (because of the sedation medicines used during the test).    FOLLOW UP: Our staff will call the number listed on your records the next business day following your procedure to check on you and address any questions or concerns that you may have regarding the information given to you following your procedure. If we do not reach you, we will leave a message.  However, if you are feeling well and you are not experiencing any problems, there is no need to return our call.  We will assume that you have returned to your regular daily activities without incident.  If any biopsies were taken you will be contacted by phone or by letter within the next 1-3 weeks.  Please call us at [phone] if you have not heard about the biopsies in 3 weeks.    SIGNATURES/CONFIDENTIALITY: You and/or your care partner have signed paperwork which will be entered into your electronic medical record.  These signatures attest to the fact that that the information above on your After Visit Summary has been reviewed and is understood.  Full responsibility of the confidentiality of this discharge information lies with you and/or your care-partner.

## 2017-10-10 NOTE — Progress Notes (Signed)
Called to room to assist during endoscopic procedure.  Patient ID and intended procedure confirmed with present staff. Received instructions for my participation in the procedure from the performing physician.  

## 2017-10-10 NOTE — Progress Notes (Signed)
Report to PACU, RN, vss, BBS= Clear.  

## 2017-10-11 ENCOUNTER — Telehealth: Payer: Self-pay

## 2017-10-11 NOTE — Telephone Encounter (Signed)
  Follow up Call-  Call back number 10/10/2017 09/20/2017  Post procedure Call Back phone  # [phone]  Permission to leave phone message Yes Yes  Some recent data might be hidden     Patient questions:  Do you have a fever, pain , or abdominal swelling? No. Pain Score  0 *  Have you tolerated food without any problems? Yes.    Have you been able to return to your normal activities? Yes.    Do you have any questions about your discharge instructions: Diet   No. Medications  No. Follow up visit  No.  Do you have questions or concerns about your Care? No.  Actions: * If pain score is 4 or above: No action needed, pain <4.  No problems noted per pt. maw

## 2017-10-11 NOTE — Telephone Encounter (Signed)
Number identifier, left a voicemail. 

## 2017-10-20 ENCOUNTER — Encounter: Payer: Self-pay | Admitting: Gastroenterology

## 2019-04-19 ENCOUNTER — Encounter (HOSPITAL_COMMUNITY): Payer: Self-pay | Admitting: Emergency Medicine

## 2019-04-19 ENCOUNTER — Emergency Department (HOSPITAL_COMMUNITY)
Admission: EM | Admit: 2019-04-19 | Discharge: 2019-04-20 | Disposition: A | Discharge status: Home / Self Care | Payer: Self-pay | Attending: Emergency Medicine | Admitting: Emergency Medicine

## 2019-04-19 ENCOUNTER — Other Ambulatory Visit: Payer: Self-pay

## 2019-04-19 ENCOUNTER — Emergency Department (HOSPITAL_COMMUNITY): Payer: Self-pay

## 2019-04-19 DIAGNOSIS — L03116 Cellulitis of left lower limb: Secondary | ICD-10-CM | POA: Insufficient documentation

## 2019-04-19 DIAGNOSIS — Z79899 Other long term (current) drug therapy: Secondary | ICD-10-CM | POA: Insufficient documentation

## 2019-04-19 DIAGNOSIS — J45909 Unspecified asthma, uncomplicated: Secondary | ICD-10-CM | POA: Insufficient documentation

## 2019-04-19 DIAGNOSIS — B353 Tinea pedis: Secondary | ICD-10-CM

## 2019-04-19 DIAGNOSIS — M79672 Pain in left foot: Secondary | ICD-10-CM

## 2019-04-19 DIAGNOSIS — I10 Essential (primary) hypertension: Secondary | ICD-10-CM | POA: Insufficient documentation

## 2019-04-19 DIAGNOSIS — Z87891 Personal history of nicotine dependence: Secondary | ICD-10-CM | POA: Insufficient documentation

## 2019-04-19 DIAGNOSIS — L03119 Cellulitis of unspecified part of limb: Secondary | ICD-10-CM

## 2019-04-19 LAB — CBC WITH DIFFERENTIAL/PLATELET
Abs Immature Granulocytes: 0.03 10*3/uL (ref 0.00–0.07)
Basophils Absolute: 0 10*3/uL (ref 0.0–0.1)
Basophils Relative: 0 %
Eosinophils Absolute: 0.2 10*3/uL (ref 0.0–0.5)
Eosinophils Relative: 3 %
HCT: 42 % (ref 39.0–52.0)
Hemoglobin: 12.9 g/dL — ABNORMAL LOW (ref 13.0–17.0)
Immature Granulocytes: 0 %
Lymphocytes Relative: 26 %
Lymphs Abs: 1.9 10*3/uL (ref 0.7–4.0)
MCH: 27.6 pg (ref 26.0–34.0)
MCHC: 30.7 g/dL (ref 30.0–36.0)
MCV: 89.7 fL (ref 80.0–100.0)
Monocytes Absolute: 0.7 10*3/uL (ref 0.1–1.0)
Monocytes Relative: 10 %
Neutro Abs: 4.5 10*3/uL (ref 1.7–7.7)
Neutrophils Relative %: 61 %
Platelets: 215 10*3/uL (ref 150–400)
RBC: 4.68 MIL/uL (ref 4.22–5.81)
RDW: 14.5 % (ref 11.5–15.5)
WBC: 7.3 10*3/uL (ref 4.0–10.5)
nRBC: 0 % (ref 0.0–0.2)

## 2019-04-19 LAB — COMPREHENSIVE METABOLIC PANEL
ALT: 20 U/L (ref 0–44)
AST: 22 U/L (ref 15–41)
Albumin: 4.3 g/dL (ref 3.5–5.0)
Alkaline Phosphatase: 67 U/L (ref 38–126)
Anion gap: 9 (ref 5–15)
BUN: 16 mg/dL (ref 6–20)
CO2: 26 mmol/L (ref 22–32)
Calcium: 9.8 mg/dL (ref 8.9–10.3)
Chloride: 102 mmol/L (ref 98–111)
Creatinine, Ser: 1.11 mg/dL (ref 0.61–1.24)
GFR calc Af Amer: >60 mL/min (ref >60)
GFR calc non Af Amer: >60 mL/min (ref >60)
Glucose, Bld: 105 mg/dL — ABNORMAL HIGH (ref 70–99)
Potassium: 4.1 mmol/L (ref 3.5–5.1)
Sodium: 137 mmol/L (ref 135–145)
Total Bilirubin: 0.5 mg/dL (ref 0.3–1.2)
Total Protein: 7.9 g/dL (ref 6.5–8.1)

## 2019-04-19 LAB — URINALYSIS, ROUTINE W REFLEX MICROSCOPIC
Bilirubin Urine: NEGATIVE
Glucose, UA: NEGATIVE mg/dL
Hgb urine dipstick: NEGATIVE
Ketones, ur: NEGATIVE mg/dL
Leukocytes,Ua: NEGATIVE
Nitrite: NEGATIVE
Protein, ur: NEGATIVE mg/dL
Specific Gravity, Urine: 1.019 (ref 1.005–1.030)
pH: 6 (ref 5.0–8.0)

## 2019-04-19 LAB — CBG MONITORING, ED: Glucose-Capillary: 104 mg/dL — ABNORMAL HIGH (ref 70–99)

## 2019-04-19 MED ORDER — CEPHALEXIN 500 MG PO CAPS: 500.0000 mg | ORAL_CAPSULE | Freq: Four times a day (QID) | ORAL | 0 refills | Status: AC

## 2019-04-19 MED ORDER — KETOCONAZOLE 2 % EX CREA: TOPICAL_CREAM | CUTANEOUS | 0 refills | Status: DC

## 2019-04-19 NOTE — Discharge Instructions (Addendum)
Thank you for allowing me to care for you today in the Emergency Department.   Take 1 tablet of Keflex every 6 hours for the next 5 days for the redness and infection to the top of your foot.  Your first dose was given tonight in the ER.  Apply ketoconazole cream between the fourth and fifth toes once daily.  Then, apply a piece of gauze between the digits to help keep them dry.  It is very important to try and reduce the moisture of your feet and toes to treat this infection.  When you are not at work, take off your shoes and socks to allow the foot and toes to remain dry.  You can elevate the left leg toe with pain. Take 650 mg of Tylenol or 600 mg of ibuprofen with food every 6 hours for pain.  You can alternate between these 2 medications every 3 hours if your pain returns.  For instance, you can take Tylenol at noon, followed by a dose of ibuprofen at 3, followed by second dose of Tylenol and 6.  You can try to get established with [facility] and wellness stay will have a primary care provider.  You need to have the foot looked at if you develop fever, chills, red streaking up the leg, or if the foot gets red, hot to the touch, or very swollen, particularly after you have been on antibiotics for 48 hours.

## 2019-04-19 NOTE — ED Provider Notes (Signed)
38 year old male received a signout from Dr. Sherry Ruffing pending labs.  In brief, the patient developed worsening pain and redness to the left foot over the last 2 days.  The patient is also concerned that he may have diabetes.  Per his HPI:  "Derek Ingram is a 38 y.o. male.     The history is provided by the patient and medical records. No language interpreter was used.  Foot Pain This is a new problem. The current episode started 2 days ago. The problem occurs constantly. The problem has been gradually worsening. Pertinent negatives include no chest pain, no abdominal pain, no headaches and no shortness of breath. Nothing aggravates the symptoms. Nothing relieves the symptoms. He has tried nothing for the symptoms. The treatment provided no relief."    Physical Exam  BP (!) 146/95   Pulse 78   Temp 99 F (37.2 C)   Resp 16   SpO2 99%   Physical Exam Vitals signs and nursing note reviewed.  Constitutional:      Appearance: He is well-developed.  HENT:     Head: Normocephalic and atraumatic.  Neck:     Musculoskeletal: Neck supple.  Pulmonary:     Effort: Pulmonary effort is normal.  Skin:    Comments: Macerated skin noted to the interdigital space of the left fourth and fifth digits.  Erythema noted to the dorsum of the left foot.  No red streaking.  Minimal warmth.  No edema. NVI.  Neurological:     Mental Status: He is alert.     Cranial Nerves: No cranial nerve deficit.  Psychiatric:        Behavior: Behavior normal.     ED Course/Procedures     Procedures  MDM   38 year old male received a signout from Dr. Sherry Ruffing pending labs.  He is concerned that he may have diabetes, but glucose is 105 today.  He has no leukocytosis and labs are otherwise unremarkable.  X-ray without evidence of osteomyelitis.  We will provide the patient with ketoconazole cream and home wound care instructions, including gauze placed between the fourth and fifth toes, was given in the  ER.  We will also discharge on a course of Keflex, first dose given in the ER.  We will provide him with a referral to [facility] health and wellness to get established with primary care.  All questions answered.  ER return precautions given.  He is hemodynamically stable and in no acute distress.  Safe for discharge home with outpatient follow-up.   Joanne Gavel, PA-C 04/20/19 0005    Tegeler, Gwenyth Allegra, MD 04/20/19 2325

## 2019-04-19 NOTE — ED Triage Notes (Addendum)
Patient c/o swelling and pain to toes on left foot x3. Denies injury. Ambulatory.

## 2019-04-19 NOTE — ED Provider Notes (Signed)
[facility] DEPT Provider Note   CSN: [id number] Arrival date & time: 04/19/19  1933    History   Chief Complaint Chief Complaint  Patient presents with  . Foot Pain    HPI Derek Ingram is a 38 y.o. male.     The history is provided by the patient and medical records. No language interpreter was used.  Foot Pain This is a new problem. The current episode started 2 days ago. The problem occurs constantly. The problem has been gradually worsening. Pertinent negatives include no chest pain, no abdominal pain, no headaches and no shortness of breath. Nothing aggravates the symptoms. Nothing relieves the symptoms. He has tried nothing for the symptoms. The treatment provided no relief.    Past Medical History:  Diagnosis Date  . Asthma   . GERD (gastroesophageal reflux disease)   . Hyperlipemia   . Hypertension     Patient Active Problem List   Diagnosis Date Noted  . Loss of weight 09/14/2017  . Rectal bleeding 09/13/2017  . Abdominal pain, epigastric 09/13/2017  . Nausea without vomiting 09/13/2017  . Gastroesophageal reflux disease 09/13/2017  . Uncomplicated alcohol dependence ([place]) 04/20/2016  . Major depressive disorder, recurrent severe without psychotic features ([place]) 04/20/2016  . Major depressive disorder 04/20/2016    Past Surgical History:  Procedure Laterality Date  . FRACTURE SURGERY     left hand  . UPPER GASTROINTESTINAL ENDOSCOPY          Home Medications    Prior to Admission medications   Medication Sig Start Date End Date Taking? Authorizing Provider  dicyclomine (BENTYL) 10 MG capsule Take 1 capsule (10 mg total) by mouth 3 (three) times daily before meals. 09/20/17   Ladene Artist, MD  hydrocortisone (ANUSOL-HC) 25 MG suppository Place 1 suppository (25 mg total) rectally at bedtime. Patient not taking: Reported on 10/03/2017 09/09/17   Zehr, Laban Emperor, PA-C  ibuprofen (ADVIL,MOTRIN) 200 MG tablet  Take 800 mg by mouth every 6 (six) hours as needed for headache, mild pain or moderate pain.    [provider]  omeprazole (PRILOSEC) 40 MG capsule Take 1 capsule (40 mg total) by mouth 2 (two) times daily. 09/09/17   Zehr, Laban Emperor, PA-C    Family History Family History  Problem Relation Age of Onset  . Liver disease Mother   . Coronary artery disease Father   . Colon cancer Neg Hx     Social History Social History   Tobacco Use  . Smoking status: Former Smoker    Types: Cigars    Quit date: 09/07/2015    Years since quitting: 3.6  . Smokeless tobacco: Never Used  Substance Use Topics  . Alcohol use: No    Frequency: Never  . Drug use: No     Allergies   Shrimp [shellfish allergy]   Review of Systems Review of Systems  Constitutional: Negative for chills, diaphoresis, fatigue and fever.  HENT: Negative for congestion, ear pain and sore throat.   Eyes: Negative for pain and visual disturbance.  Respiratory: Negative for cough and shortness of breath.   Cardiovascular: Negative for chest pain and palpitations.  Gastrointestinal: Negative for abdominal pain and vomiting.  Endocrine: Positive for polyuria.  Genitourinary: Positive for frequency. Negative for dysuria and hematuria.  Musculoskeletal: Negative for arthralgias and back pain.  Skin: Positive for rash. Negative for color change.  Neurological: Negative for dizziness, seizures, syncope, weakness, light-headedness and headaches.  Psychiatric/Behavioral: Negative for agitation.  All other systems reviewed and are negative.    Physical Exam Updated Vital Signs BP (!) 146/95   Pulse 78   Temp 99 F (37.2 C)   Resp 16   SpO2 99%   Physical Exam Vitals signs and nursing note reviewed.  Constitutional:      General: He is not in acute distress.    Appearance: He is well-developed. He is not ill-appearing or toxic-appearing.  HENT:     Head: Normocephalic and atraumatic.     Nose: No congestion  or rhinorrhea.     Mouth/Throat:     Pharynx: No oropharyngeal exudate or posterior oropharyngeal erythema.  Eyes:     Conjunctiva/sclera: Conjunctivae normal.     Pupils: Pupils are equal, round, and reactive to light.  Neck:     Musculoskeletal: Neck supple.  Cardiovascular:     Rate and Rhythm: Normal rate and regular rhythm.     Heart sounds: No murmur.  Pulmonary:     Effort: Pulmonary effort is normal. No respiratory distress.     Breath sounds: Normal breath sounds. No wheezing, rhonchi or rales.  Chest:     Chest wall: No tenderness.  Abdominal:     General: Abdomen is flat.     Palpations: Abdomen is soft.     Tenderness: There is no abdominal tenderness. There is no right CVA tenderness, left CVA tenderness, guarding or rebound.  Musculoskeletal:        General: Tenderness present. No signs of injury.     Right lower leg: No edema.     Left lower leg: No edema.     Left foot: Tenderness present. No crepitus or laceration.       Feet:  Skin:    General: Skin is warm and dry.     Capillary Refill: Capillary refill takes less than 2 seconds.     Findings: Rash present.  Neurological:     General: No focal deficit present.     Mental Status: He is alert.  Psychiatric:        Mood and Affect: Mood normal.      ED Treatments / Results  Labs (all labs ordered are listed, but only abnormal results are displayed) Labs Reviewed  CBC WITH DIFFERENTIAL/PLATELET - Abnormal; Notable for the following components:      Result Value   Hemoglobin 12.9 (*)    All other components within normal limits  COMPREHENSIVE METABOLIC PANEL - Abnormal; Notable for the following components:   Glucose, Bld 105 (*)    All other components within normal limits  CBG MONITORING, ED - Abnormal; Notable for the following components:   Glucose-Capillary 104 (*)    All other components within normal limits  URINE CULTURE  URINALYSIS, ROUTINE W REFLEX MICROSCOPIC    EKG None  Radiology  Dg Foot Complete Left  Result Date: 04/19/2019 CLINICAL DATA:  Concern for infection, left foot fifth digit EXAM: LEFT FOOT - COMPLETE 3+ VIEW COMPARISON:  None. FINDINGS: Mild soft tissue swelling is noted along the lateral aspect of foot and about the fifth digit without subjacent osseous erosion, immature periostitis or other destructive change to suggest early feature of osteomyelitis. No acute fracture or traumatic malalignment is evident. Midfoot and hindfoot alignment is grossly preserved though incompletely assessed on nonweightbearing films. Mild degenerative changes are present at the ankle. Remaining soft tissues are unremarkable. IMPRESSION: Soft tissue swelling along the lateral aspect of the foot and fifth digit without radiographic evidence of osteomyelitis. If  there is persisting clinical concern MRI is more sensitive and specific for early changes. Electronically Signed   By: Lovena Le M.D.   On: 04/19/2019 21:28    Procedures Procedures (including critical care time)  Medications Ordered in ED Medications - No data to display   Initial Impression / Assessment and Plan / ED Course  I have reviewed the triage vital signs and the nursing notes.  Pertinent labs & imaging results that were available during my care of the patient were reviewed by me and considered in my medical decision making (see chart for details).        Derek Ingram is a 38 y.o. male with a past medical history significant for hypertension, asthma, hyperlipidemia, and GERD who presents with left foot pain with redness and increased urination.  Patient reports that for the last few weeks he has had increased urination.  He reports a family history of diabetes and would like to be checked for this.  He denies any chest pain, palpitations or significant fatigue.  Denies any shortness of breath, nausea, vomiting, or GI symptoms.  Reports of her last few days he has had pain develop in his left foot  between his fourth and fifth toes.  He reports it is similar to when he had an athlete's foot infection in the past that developed cellulitis.  He reports the pain is severe but denies trauma.  On exam, patient does have evidence of athlete's foot between his toes and the fourth and fifth toes.  Patient does have tenderness and erythema more proximal in the foot.  Significant tenderness.  Patient does have palpable DP and PT pulse as well as normal sensation and strength in the foot.  Knee and hip unremarkable.  Redness only extends the top of foot and does not go past the ankle.  Lungs clear chest nontender.  Exam otherwise unremarkable.  Clinical concern patient is developed a cellulitis from the cracks in the skin likely related to the athlete's foot.  He will likely need antifungal and antibiotics for the cellulitis however I will get x-ray to look for subcutaneous gas given the tenderness and erythema as well as evidence of osteomyelitis.  Will get blood test to look for diabetes or urinary tract infection given the urinary frequency.  No dysuria.  Anticipate reassessment after work-up.  Anticipate discharge with antifungal and abx for cellulitis if x-ray reassuring.  Initial CBG shows glucose of 104, doubt new diagnosis of diabetes.  Patient still awaiting other labs.  X-ray of the foot shows soft tissue swelling but no evidence of osteomyelitis.  Care transferred to oncoming team while awaiting results of diagnostic labs.  Anticipate discharge with antifungal and antibiotics for the cellulitis and athlete's foot with PCP follow-up.  Care transferred in stable condition.   Final Clinical Impressions(s) / ED Diagnoses   Final diagnoses:  Cellulitis of foot  Tinea pedis of left foot  Left foot pain     Clinical Impression: 1. Cellulitis of foot   2. Tinea pedis of left foot   3. Left foot pain     Disposition: Care transferred to oncoming team while awaiting for diagnostic labs.   Anticipate discharge for outpatient treatment of cellulitis and athlete's foot.  This note was prepared with assistance of Systems analyst. Occasional wrong-word or sound-a-like substitutions may have occurred due to the inherent limitations of voice recognition software.      Nirav Sweda, Gwenyth Allegra, MD 04/19/19 [phone]  Kenderick Kobler, Gwenyth Allegra, MD 04/19/19 2340

## 2019-04-19 NOTE — ED Provider Notes (Deleted)
[facility] DEPT Provider Note   CSN: [id number] Arrival date & time: 04/19/19  1933     History   Chief Complaint Chief Complaint  Patient presents with   Foot Pain    HPI Derek Ingram is a 38 y.o. male.     The history is provided by the patient and medical records. No language interpreter was used.  Foot Pain This is a new problem. The current episode started 2 days ago. The problem occurs constantly. The problem has been gradually worsening. Pertinent negatives include no chest pain, no abdominal pain, no headaches and no shortness of breath. Nothing aggravates the symptoms. Nothing relieves the symptoms. He has tried nothing for the symptoms. The treatment provided no relief.    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 Authorizing Provider  dicyclomine (BENTYL) 10 MG capsule Take 1 capsule (10 mg total) by mouth 3 (three) times daily before meals. 09/20/17   Ladene Artist, MD  hydrocortisone (ANUSOL-HC) 25 MG suppository Place 1 suppository (25 mg total) rectally at bedtime. Patient not taking: Reported on 10/03/2017 09/09/17   Zehr, Laban Emperor, PA-C  ibuprofen (ADVIL,MOTRIN) 200 MG tablet  Take 800 mg by mouth every 6 (six) hours as needed for headache, mild pain or moderate pain.    [provider]  omeprazole (PRILOSEC) 40 MG capsule Take 1 capsule (40 mg total) by mouth 2 (two) times daily. 09/09/17   Zehr, Laban Emperor, PA-C    Family History Family History  Problem Relation Age of Onset   Liver disease Mother    Coronary artery disease Father    Colon cancer Neg Hx     Social History Social History   Tobacco Use   Smoking status: Former Smoker    Types: Cigars    Quit date: 09/07/2015    Years since quitting: 3.6   Smokeless tobacco: Never Used  Substance Use Topics   Alcohol use: No    Frequency: Never   Drug use: No     Allergies   Shrimp [shellfish allergy]   Review of Systems Review of Systems  Constitutional: Negative for chills, diaphoresis, fatigue and fever.  HENT: Negative for congestion, ear pain and sore throat.   Eyes: Negative for pain and visual disturbance.  Respiratory: Negative for cough and shortness of breath.   Cardiovascular: Negative for chest pain and palpitations.  Gastrointestinal: Negative for abdominal pain and vomiting.  Endocrine: Positive for polyuria.  Genitourinary: Positive for frequency. Negative for dysuria and hematuria.  Musculoskeletal: Negative for arthralgias and back pain.  Skin: Positive for rash. Negative for color change.  Neurological: Negative for dizziness, seizures, syncope, weakness, light-headedness and headaches.  Psychiatric/Behavioral: Negative for  agitation.  All other systems reviewed and are negative.    Physical Exam Updated Vital Signs BP (!) 146/95    Pulse 78    Temp 99 F (37.2 C)    Resp 16    SpO2 99%   Physical Exam Vitals signs and nursing note reviewed.  Constitutional:      General: He is not in acute distress.    Appearance: He is well-developed. He is not ill-appearing or toxic-appearing.  HENT:     Head: Normocephalic and atraumatic.     Nose: No congestion  or rhinorrhea.     Mouth/Throat:     Pharynx: No oropharyngeal exudate or posterior oropharyngeal erythema.  Eyes:     Conjunctiva/sclera: Conjunctivae normal.     Pupils: Pupils are equal, round, and reactive to light.  Neck:     Musculoskeletal: Neck supple.  Cardiovascular:     Rate and Rhythm: Normal rate and regular rhythm.     Heart sounds: No murmur.  Pulmonary:     Effort: Pulmonary effort is normal. No respiratory distress.     Breath sounds: Normal breath sounds. No wheezing, rhonchi or rales.  Chest:     Chest wall: No tenderness.  Abdominal:     General: Abdomen is flat.     Palpations: Abdomen is soft.     Tenderness: There is no abdominal tenderness. There is no right CVA tenderness, left CVA tenderness, guarding or rebound.  Musculoskeletal:        General: Tenderness present. No signs of injury.     Right lower leg: No edema.     Left lower leg: No edema.     Left foot: Tenderness present. No crepitus or laceration.       Feet:  Skin:    General: Skin is warm and dry.     Capillary Refill: Capillary refill takes less than 2 seconds.     Findings: Rash present.  Neurological:     General: No focal deficit present.     Mental Status: He is alert.  Psychiatric:        Mood and Affect: Mood normal.      ED Treatments / Results  Labs (all labs ordered are listed, but only abnormal results are displayed) Labs Reviewed  URINE CULTURE  CBC WITH DIFFERENTIAL/PLATELET  COMPREHENSIVE METABOLIC PANEL  URINALYSIS, ROUTINE W REFLEX MICROSCOPIC  CBG MONITORING, ED    EKG None  Radiology Dg Foot Complete Left  Result Date: 04/19/2019 CLINICAL DATA:  Concern for infection, left foot fifth digit EXAM: LEFT FOOT - COMPLETE 3+ VIEW COMPARISON:  None. FINDINGS: Mild soft tissue swelling is noted along the lateral aspect of foot and about the fifth digit without subjacent osseous erosion, immature periostitis or other destructive change to suggest early feature of  osteomyelitis. No acute fracture or traumatic malalignment is evident. Midfoot and hindfoot alignment is grossly preserved though incompletely assessed on nonweightbearing films. Mild degenerative changes are present at the ankle. Remaining soft tissues are unremarkable. IMPRESSION: Soft tissue swelling along the lateral aspect of the foot and fifth digit without radiographic evidence of osteomyelitis. If there is persisting clinical concern MRI is more sensitive and specific for early changes. Electronically Signed   By: Lovena Le M.D.   On: 04/19/2019 21:28    Procedures Procedures (including critical care time)  Medications Ordered in ED Medications - No data to display   Initial Impression / Assessment and Plan / ED Course  I have reviewed the triage  vital signs and the nursing notes.  Pertinent labs & imaging results that were available during my care of the patient were reviewed by me and considered in my medical decision making (see chart for details).        Derek Ingram is a 38 y.o. male with a past medical history significant for hypertension, asthma, hyperlipidemia, and GERD who presents with left foot pain with redness and increased urination.  Patient reports that for the last few weeks he has had increased urination.  He reports a family history of diabetes and would like to be checked for this.  He denies any chest pain, palpitations or significant fatigue.  Denies any shortness of breath, nausea, vomiting, or GI symptoms.  Reports of her last few days he has had pain develop in his left foot between his fourth and fifth toes.  He reports it is similar to when he had an athlete's foot infection in the past that developed cellulitis.  He reports the pain is severe but denies trauma.  On exam, patient does have evidence of athlete's foot between his toes and the fourth and fifth toes.  Patient does have tenderness and erythema more proximal in the foot.  Significant  tenderness.  Patient does have palpable DP and PT pulse as well as normal sensation and strength in the foot.  Knee and hip unremarkable.  Redness only extends the top of foot and does not go past the ankle.  Lungs clear chest nontender.  Exam otherwise unremarkable.  Clinical concern patient is developed a cellulitis from the cracks in the skin likely related to the athlete's foot.  He will likely need antifungal and antibiotics for the cellulitis however I will get x-ray to look for subcutaneous gas given the tenderness and erythema as well as evidence of osteomyelitis.  Will get blood test to look for diabetes or urinary tract infection given the urinary frequency.  No dysuria.  Anticipate reassessment after work-up.  Anticipate discharge with antifungal and abx for cellulitis if x-ray reassuring.  Initial CBG shows glucose of 104, doubt new diagnosis of diabetes.  Patient still awaiting other labs.  X-ray of the foot shows soft tissue swelling but no evidence of osteomyelitis.  Care transferred to oncoming team while awaiting results of diagnostic labs.  Anticipate discharge with antifungal and antibiotics for the cellulitis and athlete's foot with PCP follow-up.  Care transferred in stable condition.   Final Clinical Impressions(s) / ED Diagnoses   Final diagnoses:  Cellulitis of foot  Tinea pedis of left foot  Left foot pain     Clinical Impression: 1. Cellulitis of foot   2. Tinea pedis of left foot   3. Left foot pain     Disposition: Care transferred to oncoming team while awaiting for diagnostic labs.  Anticipate discharge for outpatient treatment of cellulitis and athlete's foot.  This note was prepared with assistance of Systems analyst. Occasional wrong-word or sound-a-like substitutions may have occurred due to the inherent limitations of voice recognition software.      Katrena Stehlin, Gwenyth Allegra, MD 04/19/19 [phone]

## 2019-04-20 MED ORDER — CEPHALEXIN 500 MG PO CAPS
500.0000 mg | ORAL_CAPSULE | Freq: Once | ORAL | Status: AC
  Administered 2019-04-20: 500 mg via ORAL
  Filled 2019-04-20: qty 1

## 2019-04-21 LAB — URINE CULTURE: Culture: NO GROWTH

## 2019-07-26 ENCOUNTER — Other Ambulatory Visit: Payer: Self-pay

## 2019-07-26 ENCOUNTER — Emergency Department (HOSPITAL_COMMUNITY)
Admission: EM | Admit: 2019-07-26 | Discharge: 2019-07-26 | Disposition: A | Discharge status: Home / Self Care | Payer: Self-pay | Attending: Emergency Medicine | Admitting: Emergency Medicine

## 2019-07-26 DIAGNOSIS — L02612 Cutaneous abscess of left foot: Secondary | ICD-10-CM | POA: Insufficient documentation

## 2019-07-26 DIAGNOSIS — L03116 Cellulitis of left lower limb: Secondary | ICD-10-CM | POA: Insufficient documentation

## 2019-07-26 DIAGNOSIS — Z79899 Other long term (current) drug therapy: Secondary | ICD-10-CM | POA: Insufficient documentation

## 2019-07-26 DIAGNOSIS — L0390 Cellulitis, unspecified: Secondary | ICD-10-CM

## 2019-07-26 DIAGNOSIS — J45909 Unspecified asthma, uncomplicated: Secondary | ICD-10-CM | POA: Insufficient documentation

## 2019-07-26 DIAGNOSIS — B353 Tinea pedis: Secondary | ICD-10-CM | POA: Insufficient documentation

## 2019-07-26 DIAGNOSIS — Z87891 Personal history of nicotine dependence: Secondary | ICD-10-CM | POA: Insufficient documentation

## 2019-07-26 DIAGNOSIS — L0201 Cutaneous abscess of face: Secondary | ICD-10-CM | POA: Insufficient documentation

## 2019-07-26 DIAGNOSIS — I10 Essential (primary) hypertension: Secondary | ICD-10-CM | POA: Insufficient documentation

## 2019-07-26 MED ORDER — CLOTRIMAZOLE 1 % EX CREA: TOPICAL_CREAM | CUTANEOUS | 0 refills | Status: DC

## 2019-07-26 MED ORDER — CEPHALEXIN 500 MG PO CAPS: 500.0000 mg | ORAL_CAPSULE | Freq: Two times a day (BID) | ORAL | 0 refills | Status: DC

## 2019-07-26 MED ORDER — ACETAMINOPHEN 500 MG PO TABS
1000.0000 mg | ORAL_TABLET | Freq: Once | ORAL | Status: AC
  Administered 2019-07-26: 15:00:00 1000 mg via ORAL
  Filled 2019-07-26: qty 2

## 2019-07-26 MED ORDER — LIDOCAINE HCL (PF) 1 % IJ SOLN
30.0000 mL | Freq: Once | INTRAMUSCULAR | Status: AC
  Administered 2019-07-26: 15:00:00 30 mL

## 2019-07-26 NOTE — Discharge Instructions (Addendum)
FU with [facility]. Take to the you can use antifungal cream as prescribed.  Please follow-up with primary care doctor for reevaluation.  Please call [facility] today to schedule appointment.

## 2019-07-26 NOTE — ED Provider Notes (Signed)
[facility] DEPT Provider Note   CSN: [id number] Arrival date & time: 07/26/19  1205     History   Chief Complaint Chief Complaint  Patient presents with  . Abscess    Foot and Chin    HPI Derek Ingram is a 38 y.o. male hypertension, hyperlipidemia, chronic athlete's foot for years     HPI  Patient presents for area of swelling under his chin since Friday as well as area of swelling just proximal to proximal joint of left small toe.   Patient states he was seen in August for similar presentation of his foot and given antibiotics to cover for cellulitis as well as a ketoconazole cream.  Patient states he was unable to get the ketoconazole cream at the pharmacy but states that he has used over-the-counter creams and powders for multiple years for his toe fungus.  Patient states he has had no relief needs.  Patient does state that the Keflex he was prescribed to help keep the swelling down from his foot but it never resolved completely.  States that over the past 1 week it has become achy and painful which is constant worse with touch. States area looks more swollen again.  States it feels similar to prior episode.  Patient states on Friday he shaved under his chin and states Sunday he felt an irritated area that became larger and more swollen.  States that since that he has been constantly painful, tender to touch and feels like an abscess to him.   Patient has any fevers, chills, nausea, vomiting.  Past Medical History:  Diagnosis Date  . Asthma   . GERD (gastroesophageal reflux disease)   . Hyperlipemia   . Hypertension     Patient Active Problem List   Diagnosis Date Noted  . Loss of weight 09/14/2017  . Rectal bleeding 09/13/2017  . Abdominal pain, epigastric 09/13/2017  . Nausea without vomiting 09/13/2017  . Gastroesophageal reflux disease 09/13/2017  . Uncomplicated alcohol dependence ([place]) 04/20/2016  . 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 Major depressive disorder 04/20/2016    Past Surgical History:  Procedure Laterality Date  . FRACTURE SURGERY     left hand  . UPPER GASTROINTESTINAL ENDOSCOPY          Home Medications    Prior to Admission medications   Medication Sig Start Date End Date Taking? Authorizing Provider  cephALEXin (KEFLEX) 500 MG capsule Take 1 capsule (500 mg total) by mouth 2 (two) times daily. 07/26/19   Tedd Sias, PA  clotrimazole (LOTRIMIN) 1 % cream Apply to affected area 2 times daily Apply for 2 to 4 weeks. 07/26/19   Tedd Sias, PA  dicyclomine (BENTYL) 10 MG capsule Take 1 capsule (10 mg total) by mouth 3 (three) times daily before meals. 09/20/17   Ladene Artist, MD  ibuprofen (ADVIL,MOTRIN) 200 MG tablet Take 800 mg by mouth every 6 (six) hours as needed for headache, mild pain or moderate pain.    [provider]  ketoconazole (NIZORAL) 2 % cream Apply to the skin between your left fourth and fifth toes once daily 04/19/19   McDonald, Mia A, PA-C  omeprazole (PRILOSEC) 40 MG capsule Take 1 capsule (40 mg total) by mouth 2 (two) times daily. 09/09/17   Zehr, Laban Emperor, PA-C    Family History Family History  Problem Relation Age of Onset  . Liver disease Mother   . Coronary  artery disease Father   . Colon cancer Neg Hx     Social History Social History   Tobacco Use  . Smoking status: Former Smoker    Types: Cigars    Quit date: 09/07/2015    Years since quitting: 3.8  . Smokeless tobacco: Never Used  Substance Use Topics  . Alcohol use: No    Frequency: Never  . Drug use: No     Allergies   Shrimp [shellfish allergy]   Review of Systems Review of Systems  Constitutional: Negative for fever.  HENT: Negative for congestion.   Respiratory: Negative for shortness of breath.   Cardiovascular: Negative for chest pain.  Gastrointestinal: Negative for abdominal pain.   Genitourinary: Negative for dysuria.  Skin: Positive for wound.       Abscess to left foot and chin     Physical Exam Updated Vital Signs BP (!) 139/93   Pulse 79   Temp 98.4 F (36.9 C) (Oral)   Resp 18   Ht 5\' 10"  (1.778 m)   Wt 103.4 kg   SpO2 99%   BMI 32.71 kg/m   Physical Exam Vitals signs and nursing note reviewed.  Constitutional:      General: He is not in acute distress.    Appearance: Normal appearance. He is not ill-appearing.  HENT:     Head: Normocephalic and atraumatic.     Mouth/Throat:     Comments: No trismus, difficulty opening mouth, or mucosal lesion.  No tenderness underneath tongue.  No swelling of the posterior pharynx or erythema. Eyes:     General: No scleral icterus.       Right eye: No discharge.        Left eye: No discharge.     Conjunctiva/sclera: Conjunctivae normal.  Neck:     Musculoskeletal: Normal range of motion and neck supple. No neck rigidity.  Cardiovascular:     Rate and Rhythm: Normal rate.  Pulmonary:     Effort: Pulmonary effort is normal.     Breath sounds: No stridor.  Abdominal:     Palpations: Abdomen is soft.  Musculoskeletal:     Comments: Patient able to wiggle toes, sensation symmetric.  Lymphadenopathy:     Cervical: No cervical adenopathy.  Skin:    Capillary Refill: Capillary refill takes less than 2 seconds.     Comments: Taut erythematous swelling area of ~2cm under chin  Small, red papule with fluctuance to the superior aspect of left foot proximal to small toe (see picture)  Neurological:     Mental Status: He is alert and oriented to person, place, and time. Mental status is at baseline.             ED Treatments / Results  Labs (all labs ordered are listed, but only abnormal results are displayed) Labs Reviewed - No data to display  EKG None  Radiology No results found.  Procedures Ultrasound ED Soft Tissue  Date/Time: 07/26/2019 2:21 PM Performed by: Tedd Sias, PA  Authorized by: Tedd Sias, PA   Procedure details:    Indications: localization of abscess and evaluate for cellulitis     Transverse view:  Visualized   Longitudinal view:  Visualized   Images: not archived     Limitations:  Positioning Location:    Location: face     Side:  Midline Findings:     abscess present    cellulitis present Comments:     Chin abscess. Ultrasound ED Soft Tissue  Date/Time: 07/26/2019 2:22 PM Performed by: Tedd Sias, PA Authorized by: Tedd Sias, PA   Procedure details:    Indications: limb pain     Transverse view:  Visualized   Longitudinal view:  Visualized   Images: not archived     Limitations:  Positioning Location:    Location: lower extremity     Side:  Left Findings:     abscess present .Marland KitchenIncision and Drainage  Date/Time: 07/26/2019 2:23 PM Performed by: Tedd Sias, PA Authorized by: Tedd Sias, PA   Consent:    Consent obtained:  Verbal   Consent given by:  Patient   Risks discussed:  Bleeding, incomplete drainage and pain   Alternatives discussed:  No treatment, delayed treatment and referral Location:    Type:  Abscess   Size:  66mm   Location:  Lower extremity   Lower extremity location:  Foot   Foot location:  L foot Pre-procedure details:    Skin preparation:  Betadine Anesthesia (see MAR for exact dosages):    Anesthesia method:  Local infiltration   Local anesthetic:  Lidocaine 1% w/o epi Procedure type:    Complexity:  Simple Procedure details:    Needle aspiration: no     Incision types:  Stab incision   Incision depth:  Subcutaneous   Scalpel blade:  11   Wound management:  Probed and deloculated and irrigated with saline   Drainage:  Purulent and bloody   Drainage amount:  Scant   Wound treatment:  Wound left open   Packing materials:  None Post-procedure details:    Patient tolerance of procedure:  Tolerated well, no immediate complications .Marland KitchenIncision and Drainage   Date/Time: 07/26/2019 2:24 PM Performed by: Tedd Sias, PA Authorized by: Tedd Sias, PA   Consent:    Consent obtained:  Verbal   Consent given by:  Patient   Risks discussed:  Bleeding, incomplete drainage and pain   Alternatives discussed:  No treatment, delayed treatment and observation Location:    Type:  Abscess   Size:  2 cm   Location:  Head   Head location:  Face Pre-procedure details:    Skin preparation:  Antiseptic wash Anesthesia (see MAR for exact dosages):    Anesthesia method:  Local infiltration   Local anesthetic:  Lidocaine 1% w/o epi Procedure type:    Complexity:  Simple Procedure details:    Needle aspiration: no     Incision types:  Cruciate   Incision depth:  Dermal   Scalpel blade:  11   Wound management:  Probed and deloculated and irrigated with saline   Drainage:  Bloody and purulent   Drainage amount:  Moderate   Wound treatment:  Wound left open   Packing materials:  None Post-procedure details:    Patient tolerance of procedure:  Tolerated well, no immediate complications   (including critical care time)  Medications Ordered in ED Medications  lidocaine (PF) (XYLOCAINE) 1 % injection 30 mL (has no administration in time range)  acetaminophen (TYLENOL) tablet 1,000 mg (has no administration in time range)     Initial Impression / Assessment and Plan / ED Course  I have reviewed the triage vital signs and the nursing notes.  Pertinent labs & imaging results that were available during my care of the patient were reviewed by me and considered in my medical decision making (see chart for details).        Presents with present for abscesses of the  bottom of chin as well as left foot.  Bedside ultrasound confirmed fluid present.  Simple incision and drainage conducted and well-tolerated.  Patient with chronic, extensive foot fungus.  Given clotrimazole prescription.  Keflex prescribed for surrounding cellulitis of abscess as  well as coverage for a superinfection of athlete's foot as there is a painful area between toes. Clotrimazole for 2 to 4 weeks twice daily for fungus.  Keflex 500 every 12 hours for 10 days for possible superimposed vaginal infection also will cover indurated likely cellulitic skin surrounding foot and chin abscesses.   Tolerate procedures well, will use Tylenol for pain.  Will follow up with [facility].  Referral given.  Vitals within normal limits.  Other than mild hypertension.  Understands return precautions.    Final Clinical Impressions(s) / ED Diagnoses   Final diagnoses:  Foot abscess, left  Abscess of chin  Tinea pedis of both feet  Cellulitis, unspecified cellulitis site    ED Discharge Orders         Ordered    clotrimazole (LOTRIMIN) 1 % cream     07/26/19 1354    cephALEXin (KEFLEX) 500 MG capsule  2 times daily     07/26/19 1354           Tedd Sias, [place] 07/26/19 1521    Lennice Sites, DO 07/26/19 1638

## 2019-07-26 NOTE — ED Notes (Signed)
Pt verbalizes understanding of DC instructions. Pt belongings returned and is ambulatory out of ED.  

## 2020-03-13 ENCOUNTER — Ambulatory Visit (HOSPITAL_COMMUNITY): Payer: Self-pay | Admitting: Licensed Clinical Social Worker

## 2020-05-14 ENCOUNTER — Emergency Department (HOSPITAL_COMMUNITY)
Admission: EM | Admit: 2020-05-14 | Discharge: 2020-05-15 | Disposition: A | Discharge status: Home / Self Care | Payer: Self-pay | Attending: Emergency Medicine | Admitting: Emergency Medicine

## 2020-05-14 ENCOUNTER — Other Ambulatory Visit: Payer: Self-pay

## 2020-05-14 ENCOUNTER — Encounter (HOSPITAL_COMMUNITY): Payer: Self-pay

## 2020-05-14 DIAGNOSIS — Y999 Unspecified external cause status: Secondary | ICD-10-CM | POA: Insufficient documentation

## 2020-05-14 DIAGNOSIS — I10 Essential (primary) hypertension: Secondary | ICD-10-CM | POA: Insufficient documentation

## 2020-05-14 DIAGNOSIS — S86011A Strain of right Achilles tendon, initial encounter: Secondary | ICD-10-CM

## 2020-05-14 DIAGNOSIS — Z87891 Personal history of nicotine dependence: Secondary | ICD-10-CM | POA: Insufficient documentation

## 2020-05-14 DIAGNOSIS — J45909 Unspecified asthma, uncomplicated: Secondary | ICD-10-CM | POA: Insufficient documentation

## 2020-05-14 DIAGNOSIS — Y929 Unspecified place or not applicable: Secondary | ICD-10-CM | POA: Insufficient documentation

## 2020-05-14 DIAGNOSIS — Y9367 Activity, basketball: Secondary | ICD-10-CM | POA: Insufficient documentation

## 2020-05-14 DIAGNOSIS — Z79899 Other long term (current) drug therapy: Secondary | ICD-10-CM | POA: Insufficient documentation

## 2020-05-14 DIAGNOSIS — S86001A Unspecified injury of right Achilles tendon, initial encounter: Secondary | ICD-10-CM | POA: Insufficient documentation

## 2020-05-14 DIAGNOSIS — X509XXA Other and unspecified overexertion or strenuous movements or postures, initial encounter: Secondary | ICD-10-CM | POA: Insufficient documentation

## 2020-05-14 MED ORDER — HYDROCODONE-ACETAMINOPHEN 5-325 MG PO TABS
1.0000 | ORAL_TABLET | Freq: Once | ORAL | Status: AC
  Administered 2020-05-14: 1 via ORAL
  Filled 2020-05-14: qty 1

## 2020-05-14 MED ORDER — HYDROCODONE-ACETAMINOPHEN 5-325 MG PO TABS: 1.0000 | ORAL_TABLET | Freq: Four times a day (QID) | ORAL | 0 refills | Status: DC | PRN

## 2020-05-14 NOTE — ED Notes (Signed)
Ortho tech consulted to apply short leg splint

## 2020-05-14 NOTE — Progress Notes (Signed)
Orthopedic Tech Progress Note Patient Details:  Derek Ingram Derek Ingram 04-Feb-1981 [id number]  Ortho Devices Type of Ortho Device: Ace wrap, Post (short leg) splint, Crutches Ortho Device/Splint Location: RLE Ortho Device/Splint Interventions: Ordered, Application, Adjustment   Post Interventions Patient Tolerated: Well Instructions Provided: Care of device   [name] 05/14/2020, 11:06 PM

## 2020-05-14 NOTE — Discharge Instructions (Addendum)
Keep your splint clean and dry. Call the orthopedic doctor listed in the AM for a follow up in the coming week.

## 2020-05-14 NOTE — ED Triage Notes (Signed)
Patient arrived stating he was playing basketball and heard a loud pop and had pain in the back of his right lower leg. Patient ambulatory in triage.

## 2020-05-14 NOTE — ED Provider Notes (Signed)
[facility] Provider Note  CSN: [id number] Arrival date & time: 05/14/20 2000    History Chief Complaint  Patient presents with   Leg Pain    HPI  Derek Ingram is a 39 y.o. male with no significant past medical history reports he was playing basketball just prior to arrival when he heard a pop in his R ankle/calf. Unable to bear weight since. Moderate to severe pain with weight bearing.   Past Medical History:  Diagnosis Date   Asthma    GERD (gastroesophageal reflux disease)    Hyperlipemia    Hypertension     Past Surgical History:  Procedure Laterality Date   FRACTURE SURGERY     left hand   UPPER GASTROINTESTINAL ENDOSCOPY      Family History  Problem Relation Age of Onset   Liver disease Mother    Coronary artery disease Father    Colon cancer Neg Hx     Social History   Tobacco Use   Smoking status: Former Smoker    Types: Cigars    Quit date: 09/07/2015    Years since quitting: 4.6   Smokeless tobacco: Never Used  Vaping Use   Vaping Use: Never used  Substance Use Topics   Alcohol use: No   Drug use: No     Home Medications Prior to Admission medications   Medication Sig Start Date End Date Taking? Authorizing Provider  cephALEXin (KEFLEX) 500 MG capsule Take 1 capsule (500 mg total) by mouth 2 (two) times daily. 07/26/19   Tedd Sias, PA  clotrimazole (LOTRIMIN) 1 % cream Apply to affected area 2 times daily Apply for 2 to 4 weeks. 07/26/19   Tedd Sias, PA  dicyclomine (BENTYL) 10 MG capsule Take 1 capsule (10 mg total) by mouth 3 (three) times daily before meals. 09/20/17   Ladene Artist, MD  HYDROcodone-acetaminophen (NORCO/VICODIN) 5-325 MG tablet Take 1 tablet by mouth every 6 (six) hours as needed for severe pain. 05/14/20   Truddie Hidden, MD  ibuprofen (ADVIL,MOTRIN) 200 MG tablet Take 800 mg by mouth every 6 (six) hours as needed for headache, mild pain or moderate pain.     [provider]  ketoconazole (NIZORAL) 2 % cream Apply to the skin between your left fourth and fifth toes once daily 04/19/19   McDonald, Mia A, PA-C  omeprazole (PRILOSEC) 40 MG capsule Take 1 capsule (40 mg total) by mouth 2 (two) times daily. 09/09/17   Zehr, Laban Emperor, PA-C     Allergies    Shrimp [shellfish allergy]   Review of Systems   Review of Systems A comprehensive review of systems was completed and negative except as noted in HPI.    Physical Exam BP (!) 165/91    Pulse 65    Temp 98.9 F (37.2 C) (Oral)    Resp 18    Ht 5\' 10"  (1.778 m)    Wt 86.2 kg    SpO2 100%    BMI 27.28 kg/m   Physical Exam Vitals and nursing note reviewed.  HENT:     Head: Normocephalic.     Nose: Nose normal.  Eyes:     Extraocular Movements: Extraocular movements intact.  Pulmonary:     Effort: Pulmonary effort is normal.  Musculoskeletal:        General: Normal range of motion.     Cervical back: Neck supple.     Comments: No ankle movement with Thompson's test  on the right.   Skin:    Findings: No rash (on exposed skin).  Neurological:     Mental Status: He is alert and oriented to person, place, and time.  Psychiatric:        Mood and Affect: Mood normal.      ED Results / Procedures / Treatments   Labs (all labs ordered are listed, but only abnormal results are displayed) Labs Reviewed - No data to display  EKG None  Radiology No results found.  Procedures Procedures  Medications Ordered in the ED Medications  HYDROcodone-acetaminophen (NORCO/VICODIN) 5-325 MG per tablet 1 tablet (1 tablet Oral Given 05/14/20 2315)     MDM Rules/Calculators/A&P MDM History and exam both consistent with Achilles tendon rupture. Patient will be placed in short leg splint with ankle extension, crutches. Pain meds and Ortho followup.  ED Course  I have reviewed the triage vital signs and the nursing notes.  Pertinent labs & imaging results that were available during my  care of the patient were reviewed by me and considered in my medical decision making (see chart for details).     Final Clinical Impression(s) / ED Diagnoses Final diagnoses:  Rupture of right Achilles tendon, initial encounter    Rx / DC Orders ED Discharge Orders         Ordered    HYDROcodone-acetaminophen (NORCO/VICODIN) 5-325 MG tablet  Every 6 hours PRN        05/14/20 2328           Truddie Hidden, MD 05/14/20 2329

## 2021-02-04 ENCOUNTER — Ambulatory Visit: Payer: BC Managed Care – PPO | Admitting: Podiatry

## 2021-11-09 DIAGNOSIS — L42 Pityriasis rosea: Secondary | ICD-10-CM | POA: Diagnosis not present

## 2021-12-21 DIAGNOSIS — Z20822 Contact with and (suspected) exposure to covid-19: Secondary | ICD-10-CM | POA: Diagnosis not present

## 2021-12-21 DIAGNOSIS — Z03818 Encounter for observation for suspected exposure to other biological agents ruled out: Secondary | ICD-10-CM | POA: Diagnosis not present

## 2022-04-16 DIAGNOSIS — M542 Cervicalgia: Secondary | ICD-10-CM | POA: Diagnosis not present

## 2022-04-16 DIAGNOSIS — Z8679 Personal history of other diseases of the circulatory system: Secondary | ICD-10-CM | POA: Diagnosis not present

## 2022-04-16 DIAGNOSIS — Z8249 Family history of ischemic heart disease and other diseases of the circulatory system: Secondary | ICD-10-CM | POA: Diagnosis not present

## 2022-04-16 DIAGNOSIS — M62838 Other muscle spasm: Secondary | ICD-10-CM | POA: Diagnosis not present

## 2022-04-16 DIAGNOSIS — Z6835 Body mass index (BMI) 35.0-35.9, adult: Secondary | ICD-10-CM | POA: Diagnosis not present

## 2022-06-03 DIAGNOSIS — Z202 Contact with and (suspected) exposure to infections with a predominantly sexual mode of transmission: Secondary | ICD-10-CM | POA: Diagnosis not present

## 2023-04-28 DIAGNOSIS — M25561 Pain in right knee: Secondary | ICD-10-CM | POA: Diagnosis not present

## 2023-04-28 DIAGNOSIS — S86001A Unspecified injury of right Achilles tendon, initial encounter: Secondary | ICD-10-CM | POA: Diagnosis not present

## 2023-04-28 DIAGNOSIS — S838X1A Sprain of other specified parts of right knee, initial encounter: Secondary | ICD-10-CM | POA: Diagnosis not present

## 2023-07-13 DIAGNOSIS — M65961 Unspecified synovitis and tenosynovitis, right lower leg: Secondary | ICD-10-CM | POA: Diagnosis not present

## 2023-08-01 ENCOUNTER — Ambulatory Visit: Payer: Self-pay | Admitting: Orthopaedic Surgery

## 2023-08-09 DIAGNOSIS — R519 Headache, unspecified: Secondary | ICD-10-CM | POA: Diagnosis not present

## 2023-08-09 DIAGNOSIS — I10 Essential (primary) hypertension: Secondary | ICD-10-CM | POA: Diagnosis not present

## 2023-08-20 DIAGNOSIS — M545 Low back pain, unspecified: Secondary | ICD-10-CM | POA: Diagnosis not present

## 2023-08-20 DIAGNOSIS — H6091 Unspecified otitis externa, right ear: Secondary | ICD-10-CM | POA: Diagnosis not present

## 2023-10-06 ENCOUNTER — Ambulatory Visit (HOSPITAL_COMMUNITY)
Admission: EM | Admit: 2023-10-06 | Discharge: 2023-10-06 | Disposition: A | Discharge status: Home / Self Care | Payer: BC Managed Care – PPO | Attending: Emergency Medicine | Admitting: Emergency Medicine

## 2023-10-06 ENCOUNTER — Encounter (HOSPITAL_COMMUNITY): Payer: Self-pay

## 2023-10-06 DIAGNOSIS — J111 Influenza due to unidentified influenza virus with other respiratory manifestations: Secondary | ICD-10-CM | POA: Diagnosis not present

## 2023-10-06 LAB — POC COVID19/FLU A&B COMBO
Covid Antigen, POC: NEGATIVE
Influenza A Antigen, POC: NEGATIVE
Influenza B Antigen, POC: NEGATIVE

## 2023-10-06 MED ORDER — PSEUDOEPHEDRINE HCL 30 MG PO TABS: 30.0000 mg | ORAL_TABLET | ORAL | 0 refills | Status: AC | PRN

## 2023-10-06 MED ORDER — ONDANSETRON 4 MG PO TBDP: 4.0000 mg | ORAL_TABLET | Freq: Four times a day (QID) | ORAL | 0 refills | Status: AC | PRN

## 2023-10-06 MED ORDER — ONDANSETRON 4 MG PO TBDP
ORAL_TABLET | ORAL | Status: AC
  Filled 2023-10-06: qty 1

## 2023-10-06 MED ORDER — ACETAMINOPHEN 325 MG PO TABS
ORAL_TABLET | ORAL | Status: AC
  Filled 2023-10-06: qty 3

## 2023-10-06 MED ORDER — ONDANSETRON 4 MG PO TBDP
4.0000 mg | ORAL_TABLET | Freq: Once | ORAL | Status: AC
  Administered 2023-10-06: 4 mg via ORAL

## 2023-10-06 MED ORDER — PROMETHAZINE-DM 6.25-15 MG/5ML PO SYRP: 5.0000 mL | ORAL_SOLUTION | Freq: Four times a day (QID) | ORAL | 0 refills | Status: AC | PRN

## 2023-10-06 MED ORDER — ACETAMINOPHEN 325 MG PO TABS
975.0000 mg | ORAL_TABLET | Freq: Once | ORAL | Status: AC
  Administered 2023-10-06: 975 mg via ORAL

## 2023-10-06 NOTE — ED Provider Notes (Signed)
[facility]    CSN: [id number] Arrival date & time: 10/06/23  1824     History   Chief Complaint Chief Complaint  Patient presents with   Fatigue   Generalized Body Aches   Headache   Cough   Nasal Congestion    HPI Derek Ingram is a 43 y.o. male.  Today developed fever, chills, body aches, fatigue, congestion, and cough. Having nausea but no vomiting. Tolerating fluids. Has not taken any medications  No known sick contacts, denies recent travel   Past Medical History:  Diagnosis Date   Asthma    GERD (gastroesophageal reflux disease)    Hyperlipemia    Hypertension     Patient Active Problem List   Diagnosis Date Noted   Loss of weight 09/14/2017   Rectal bleeding 09/13/2017   Abdominal pain, epigastric 09/13/2017   Nausea without vomiting 09/13/2017   Gastroesophageal reflux disease 09/13/2017   Uncomplicated alcohol dependence (HCC) 04/20/2016   Major depressive disorder, recurrent severe without psychotic features (HCC) 04/20/2016   Major depressive disorder 04/20/2016    Past Surgical History:  Procedure Laterality Date   FRACTURE SURGERY     left hand   UPPER GASTROINTESTINAL ENDOSCOPY         Home Medications    Prior to Admission medications   Medication Sig Start Date End Date Taking? Authorizing Provider  ondansetron (ZOFRAN-ODT) 4 MG disintegrating tablet Take 1 tablet (4 mg total) by mouth every 6 (six) hours as needed for nausea or vomiting. 10/06/23  Yes Cloe Sockwell, Lurena Joiner, PA-C  promethazine-dextromethorphan (PROMETHAZINE-DM) 6.25-15 MG/5ML syrup Take 5 mLs by mouth 4 (four) times daily as needed for cough. 10/06/23  Yes Oakleigh Hesketh, Lurena Joiner, PA-C  pseudoephedrine (SUDAFED) 30 MG tablet Take 1 tablet (30 mg total) by mouth every 4 (four) hours as needed for congestion. 10/06/23  Yes Elsey Holts, Lurena Joiner, PA-C    Family History Family History  Problem Relation Age of Onset   Liver disease Mother    Coronary artery disease Father     Colon cancer Neg Hx     Social History Social History   Tobacco Use   Smoking status: Former    Types: Cigars    Quit date: 09/07/2015    Years since quitting: 8.0   Smokeless tobacco: Never  Vaping Use   Vaping status: Never Used  Substance Use Topics   Alcohol use: No   Drug use: No     Allergies   Shrimp [shellfish allergy]   Review of Systems Review of Systems Per HPI  Physical Exam Triage Vital Signs ED Triage Vitals [10/06/23 1933]  Encounter Vitals Group     BP 131/82     Systolic BP Percentile      Diastolic BP Percentile      Pulse Rate 89     Resp 16     Temp (!) 101.2 F (38.4 C)     Temp Source Oral     SpO2 98 %     Weight      Height      Head Circumference      Peak Flow      Pain Score 8     Pain Loc      Pain Education      Exclude from Growth Chart    No data found.  Updated Vital Signs BP 131/82 (BP Location: Left Arm)   Pulse 89   Temp 98.8 F (37.1 C) (Oral)   Resp 16  SpO2 98%   Physical Exam Vitals and nursing note reviewed.  Constitutional:      General: He is not in acute distress. HENT:     Right Ear: Tympanic membrane and ear canal normal.     Left Ear: Tympanic membrane and ear canal normal.     Nose: No rhinorrhea.     Mouth/Throat:     Mouth: Mucous membranes are moist.     Pharynx: Oropharynx is clear. No posterior oropharyngeal erythema.  Eyes:     Conjunctiva/sclera: Conjunctivae normal.  Cardiovascular:     Rate and Rhythm: Normal rate and regular rhythm.     Pulses: Normal pulses.     Heart sounds: Normal heart sounds.  Pulmonary:     Effort: Pulmonary effort is normal.     Breath sounds: Normal breath sounds.  Abdominal:     General: There is no distension.     Palpations: Abdomen is soft.     Tenderness: There is no abdominal tenderness. There is no guarding.  Musculoskeletal:     Cervical back: Normal range of motion.  Lymphadenopathy:     Cervical: No cervical adenopathy.  Skin:     General: Skin is warm and dry.  Neurological:     Mental Status: He is alert and oriented to person, place, and time.     UC Treatments / Results  Labs (all labs ordered are listed, but only abnormal results are displayed) Labs Reviewed  POC COVID19/FLU A&B COMBO    EKG  Radiology No results found.  Procedures Procedures   Medications Ordered in UC Medications  acetaminophen (TYLENOL) tablet 975 mg (975 mg Oral Given 10/06/23 1947)  ondansetron (ZOFRAN-ODT) disintegrating tablet 4 mg (4 mg Oral Given 10/06/23 2029)    Initial Impression / Assessment and Plan / UC Course  I have reviewed the triage vital signs and the nursing notes.  Pertinent labs & imaging results that were available during my care of the patient were reviewed by me and considered in my medical decision making (see chart for details).  Temp 101 on arrival, Tylenol dose given and fever has resolved Zofran ODT given for nausea Rapid flu A/B and COVID are negative Discussed viral etiology, symptomatic care, home and OTC options. I have sent Sudafed, promethazine, Zofran to pharmacy.  Increasing fluids.  Return precautions.  Work note provided.  Final Clinical Impressions(s) / UC Diagnoses   Final diagnoses:  Influenza-like illness     Discharge Instructions      Alternate ibuprofen and tylenol for fever and aches Sudafed can be used every 4 hours for congestion The promethazine DM cough syrup can be used up to 4 times daily. If this medication makes you drowsy, take only once before bed. The zofran can be used every 6 hours as needed to settle the stomach Make sure you are drinking lots of fluids!     ED Prescriptions     Medication Sig Dispense Auth. Provider   ondansetron (ZOFRAN-ODT) 4 MG disintegrating tablet Take 1 tablet (4 mg total) by mouth every 6 (six) hours as needed for nausea or vomiting. 20 tablet Kylah Maresh, PA-C   pseudoephedrine (SUDAFED) 30 MG tablet Take 1 tablet (30 mg  total) by mouth every 4 (four) hours as needed for congestion. 30 tablet Lannah Koike, PA-C   promethazine-dextromethorphan (PROMETHAZINE-DM) 6.25-15 MG/5ML syrup Take 5 mLs by mouth 4 (four) times daily as needed for cough. 240 mL Briane Birden, Lurena Joiner, PA-C      PDMP not reviewed  this encounter.   Kathrine Haddock 10/06/23 2037

## 2023-10-06 NOTE — Discharge Instructions (Addendum)
Alternate ibuprofen and tylenol for fever and aches Sudafed can be used every 4 hours for congestion The promethazine DM cough syrup can be used up to 4 times daily. If this medication makes you drowsy, take only once before bed. The zofran can be used every 6 hours as needed to settle the stomach Make sure you are drinking lots of fluids!

## 2023-10-06 NOTE — ED Triage Notes (Signed)
Patient reports that he began having a headache, body aches, SOB, cough, nasal congestion, and fatigue that started today.  Patient has not had any medications for his symptoms.

## 2023-10-09 ENCOUNTER — Telehealth: Payer: BC Managed Care – PPO | Admitting: Nurse Practitioner

## 2023-10-09 DIAGNOSIS — R6889 Other general symptoms and signs: Secondary | ICD-10-CM | POA: Diagnosis not present

## 2023-10-09 MED ORDER — IBUPROFEN 600 MG PO TABS: 600.0000 mg | ORAL_TABLET | Freq: Three times a day (TID) | ORAL | 0 refills | Status: AC | PRN

## 2023-10-09 NOTE — Progress Notes (Signed)
 E visit for Flu like symptoms   We are sorry that you are not feeling well.  Here is how we plan to help! Based on what you have shared with me it looks like you may have flu-like symptoms that should be watched but do not seem to indicate anti-viral treatment.  Influenza or "the flu" is   an infection caused by a respiratory virus. The flu virus is highly contagious and persons who did not receive their yearly flu vaccination may "catch" the flu from close contact.  We have anti-viral medications to treat the viruses that cause this infection. They are not a "cure" and only shorten the course of the infection. These prescriptions are most effective when they are given within the first 2 days of "flu" symptoms. Antiviral medication are indicated if you have a high risk of complications from the flu. You should  also consider an antiviral medication if you are in close contact with someone who is at risk. These medications can help patients avoid complications from the flu  but have side effects that you should know. Possible side effects from Tamiflu or oseltamivir include nausea, vomiting, diarrhea, dizziness, headaches, eye redness, sleep problems or other respiratory symptoms. You should not take Tamiflu if you have an allergy to oseltamivir or any to the ingredients in Tamiflu.  Based upon your symptoms and potential risk factors I recommend that you follow the flu symptoms recommendation that I have listed below.  ANYONE WHO HAS FLU SYMPTOMS SHOULD: . Stay home. The flu is highly contagious and going out or to work exposes others! . Be sure to drink plenty of fluids. Water is fine as well as fruit juices, sodas and electrolyte beverages. You may want to stay away from caffeine or alcohol. If you are nauseated, try taking small sips of liquids. How do you know if you are getting enough fluid? Your urine should be a pale yellow or almost colorless. . Get rest. . Taking a steamy shower or using a  humidifier may help nasal congestion and ease sore throat pain. Using a saline nasal spray works much the same way. . Cough drops, hard candies and sore throat lozenges may ease your cough. . Line up a caregiver. Have someone check on you regularly.   GET HELP RIGHT AWAY IF: . You cannot keep down liquids or your medications. . You become short of breath . Your fell like you are going to pass out or loose consciousness. . Your symptoms persist after you have completed your treatment plan MAKE SURE YOU   Understand these instructions.  Will watch your condition.  Will get help right away if you are not doing well or get worse.  Your e-visit answers were reviewed by a board certified advanced clinical practitioner to complete your personal care plan.  Depending on the condition, your plan could have included both over the counter or prescription medications.  If there is a problem please reply  once you have received a response from your provider.  Your safety is important to [place].  If you have drug allergies check your prescription carefully.    You can use MyChart to ask questions about today's visit, request a non-urgent call back, or ask for a work or school excuse for 24 hours related to this e-Visit. If it has been greater than 24 hours you will need to follow up with your provider, or enter a new e-Visit to address those concerns.  You will get an e-mail  in the next two days asking about your experience.  I hope that your e-visit has been valuable and will speed your recovery. Thank you for using e-visits.

## 2023-10-09 NOTE — Patient Instructions (Signed)
  [employer], thank you for joining Claiborne Rigg, NP for today's virtual visit.  While this provider is not your primary care provider (PCP), if your PCP is located in our provider database this encounter information will be shared with them immediately following your visit.   A [facility] MyChart account gives you access to today's visit and all your visits, tests, and labs performed at [facility] " click here if you don't have a [facility] MyChart account or go to [email or web address]  Consent: (Patient) Derek Ingram provided verbal consent for this virtual visit at the beginning of the encounter.  Current Medications:  Current Outpatient Medications:    ibuprofen (ADVIL) 600 MG tablet, Take 1 tablet (600 mg total) by mouth every 8 (eight) hours as needed., Disp: 30 tablet, Rfl: 0   ondansetron (ZOFRAN-ODT) 4 MG disintegrating tablet, Take 1 tablet (4 mg total) by mouth every 6 (six) hours as needed for nausea or vomiting., Disp: 20 tablet, Rfl: 0   promethazine-dextromethorphan (PROMETHAZINE-DM) 6.25-15 MG/5ML syrup, Take 5 mLs by mouth 4 (four) times daily as needed for cough., Disp: 240 mL, Rfl: 0   pseudoephedrine (SUDAFED) 30 MG tablet, Take 1 tablet (30 mg total) by mouth every 4 (four) hours as needed for congestion., Disp: 30 tablet, Rfl: 0   Medications ordered in this encounter:  Meds ordered this encounter  Medications   ibuprofen (ADVIL) 600 MG tablet    Sig: Take 1 tablet (600 mg total) by mouth every 8 (eight) hours as needed.    Dispense:  30 tablet    Refill:  0    Supervising Provider:   Merrilee Ingram [[id number]]     *If you need refills on other medications prior to your next appointment, please contact your pharmacy*  Follow-Up: Call back or seek an in-person evaluation if the symptoms worsen or if the condition fails to improve as anticipated.  [facility] [phone]  Other Instructions INSTRUCTIONS:  use a humidifier for nasal congestion Drink plenty of fluids, rest and wash hands frequently to avoid the spread of infection Alternate tylenol and Motrin for relief of fever    If you have been instructed to have an in-person evaluation today at a local Urgent Care facility, please use the link below. It will take you to a list of all of our available [facility], including address, phone number and hours of operation. Please do not delay care.  [facility]  If you or a family member do not have a primary care provider, use the link below to schedule a visit and establish care. When you choose a [facility] primary care physician or advanced practice provider, you gain a long-term partner in health. Find a Primary Care Provider  Learn more about [facility]'s in-office and virtual care options: [facility] Get Care Now

## 2023-10-09 NOTE — Progress Notes (Signed)
 I have spent 5 minutes in review of e-visit questionnaire, review and updating patient chart, medical decision making and response to patient.   Claiborne Rigg, NP

## 2023-10-09 NOTE — Progress Notes (Signed)
Virtual Visit Consent   Derek Ingram, you are scheduled for a virtual visit with a [facility] provider today. Just as with appointments in the office, your consent must be obtained to participate. Your consent will be active for this visit and any virtual visit you may have with one of our providers in the next 365 days. If you have a MyChart account, a copy of this consent can be sent to you electronically.  As this is a virtual visit, video technology does not allow for your provider to perform a traditional examination. This may limit your provider's ability to fully assess your condition. If your provider identifies any concerns that need to be evaluated in person or the need to arrange testing (such as labs, EKG, etc.), we will make arrangements to do so. Although advances in technology are sophisticated, we cannot ensure that it will always work on either your end or our end. If the connection with a video visit is poor, the visit may have to be switched to a telephone visit. With either a video or telephone visit, we are not always able to ensure that we have a secure connection.  By engaging in this virtual visit, you consent to the provision of healthcare and authorize for your insurance to be billed (if applicable) for the services provided during this visit. Depending on your insurance coverage, you may receive a charge related to this service.  I need to obtain your verbal consent now. Are you willing to proceed with your visit today? Derek Ingram has provided verbal consent on 10/09/2023 for a virtual visit (video or telephone). Derek Rigg, NP  Date: 10/09/2023 4:27 PM  Virtual Visit via Video Note   I, Derek Ingram, connected with  Derek Ingram  ([id number], 1981-07-02) on 10/09/23 at  4:15 PM EST by a video-enabled telemedicine application and verified that I am speaking with the correct person using two identifiers.  Location: Patient: Virtual Visit  Location Patient: Home Provider: Virtual Visit Location Provider: Home Office   I discussed the limitations of evaluation and management by telemedicine and the availability of in person appointments. The patient expressed understanding and agreed to proceed.    History of Present Illness: Derek Ingram is a 43 y.o. who identifies as a male who was assigned male at birth, and is being seen today for flu like symtoms. .  Derek Ingram has been experiencing fever with tmax 101, headache, chills, sore throat, body aches and shortness of breath. He was seen in the urgent care 4 days ago and found to be negative for flu, covid and RSV. He is currently taking promethazine cough syrup, zofran, tylenol and sudafed.   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   Major depressive disorder 04/20/2016    Allergies:  Allergies  Allergen Reactions   Shrimp [Shellfish Allergy] Anaphylaxis   Medications:  Current Outpatient Medications:    ibuprofen (ADVIL) 600 MG tablet, Take 1 tablet (600 mg total) by mouth every 8 (eight) hours as needed., Disp: 30 tablet, Rfl: 0   ondansetron (ZOFRAN-ODT) 4 MG disintegrating tablet, Take 1 tablet (4 mg total) by mouth every 6 (six) hours as  needed for nausea or vomiting., Disp: 20 tablet, Rfl: 0   promethazine-dextromethorphan (PROMETHAZINE-DM) 6.25-15 MG/5ML syrup, Take 5 mLs by mouth 4 (four) times daily as needed for cough., Disp: 240 mL, Rfl: 0   pseudoephedrine (SUDAFED) 30 MG tablet, Take 1 tablet (30 mg total) by mouth every 4 (four) hours as needed for congestion., Disp: 30 tablet, Rfl:  0  Observations/Objective: Patient is well-developed, well-nourished in no acute distress.  Resting comfortably at home.  Head is normocephalic, atraumatic.  No labored breathing.  Speech is clear and coherent with logical content.  Patient is alert and oriented at baseline.    Assessment and Plan: 1. Flu-like symptoms (Primary) - ibuprofen (ADVIL) 600 MG tablet; Take 1 tablet (600 mg total) by mouth every 8 (eight) hours as needed.  Dispense: 30 tablet; Refill: 0  INSTRUCTIONS: use a humidifier for nasal congestion Drink plenty of fluids, rest and wash hands frequently to avoid the spread of infection Alternate tylenol and Motrin for relief of fever   Follow Up Instructions: I discussed the assessment and treatment plan with the patient. The patient was provided an opportunity to ask questions and all were answered. The patient agreed with the plan and demonstrated an understanding of the instructions.  A copy of instructions were sent to the patient via MyChart unless otherwise noted below.    The patient was advised to call back or seek an in-person evaluation if the symptoms worsen or if the condition fails to improve as anticipated.    Derek Rigg, NP

## 2023-10-12 DIAGNOSIS — R519 Headache, unspecified: Secondary | ICD-10-CM | POA: Diagnosis not present

## 2023-10-12 DIAGNOSIS — Z87891 Personal history of nicotine dependence: Secondary | ICD-10-CM | POA: Diagnosis not present

## 2023-10-12 DIAGNOSIS — M549 Dorsalgia, unspecified: Secondary | ICD-10-CM | POA: Diagnosis not present

## 2023-10-12 DIAGNOSIS — R059 Cough, unspecified: Secondary | ICD-10-CM | POA: Diagnosis not present

## 2023-10-12 DIAGNOSIS — M791 Myalgia, unspecified site: Secondary | ICD-10-CM | POA: Diagnosis not present

## 2023-10-12 DIAGNOSIS — R1111 Vomiting without nausea: Secondary | ICD-10-CM | POA: Diagnosis not present

## 2023-10-12 DIAGNOSIS — J101 Influenza due to other identified influenza virus with other respiratory manifestations: Secondary | ICD-10-CM | POA: Diagnosis not present

## 2023-10-12 DIAGNOSIS — R110 Nausea: Secondary | ICD-10-CM | POA: Diagnosis not present

## 2023-10-12 DIAGNOSIS — I10 Essential (primary) hypertension: Secondary | ICD-10-CM | POA: Diagnosis not present

## 2023-11-29 DIAGNOSIS — Z1159 Encounter for screening for other viral diseases: Secondary | ICD-10-CM | POA: Diagnosis not present

## 2023-11-29 DIAGNOSIS — I10 Essential (primary) hypertension: Secondary | ICD-10-CM | POA: Diagnosis not present

## 2023-11-29 DIAGNOSIS — Z7689 Persons encountering health services in other specified circumstances: Secondary | ICD-10-CM | POA: Diagnosis not present

## 2023-11-29 DIAGNOSIS — Z23 Encounter for immunization: Secondary | ICD-10-CM | POA: Diagnosis not present

## 2023-11-29 DIAGNOSIS — J45909 Unspecified asthma, uncomplicated: Secondary | ICD-10-CM | POA: Diagnosis not present

## 2023-11-29 NOTE — Progress Notes (Signed)
 Name: Derek Ingram Date of visit: 11/29/23  Chief Complaint   Chief Complaint  Patient presents with  . Establish Care    Subjective  Derek Ingram is a 43 y.o. male who presents today at [facility] to establish care with new provider.   History of Present Illness The patient is a 43 year old male who presents to establish care.  He has a history of hypertension, managed with hydrochlorothiazide  25 mg. He reports frequent urination as a side effect of this medication. He has not been taking it regularly due to frequent urination.   He has a long-standing history of asthma, dating back to his childhood. He was prescribed an albuterol  inhaler during an ER visit on 10/12/2023, following complications from influenza. He uses the inhaler daily, administering 1 to 2 puffs every 6 hours as needed for wheezing or shortness of breath. He reports a sensation of cloudiness in his lungs and difficulty breathing, which has limited his physical activity, including playing basketball.    Current Outpatient Medications  Medication Instructions  . amLODIPine  (NORVASC ) 5 mg, oral, Daily    PAST MEDICAL, SOCIAL & FAMILY HISTORY:   Past Medical History:  Diagnosis Date  . Abdominal pain, epigastric 09/13/2017  . Asthma   . Hypertension   . 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  Alcohol use disorder, severe, chronic - F 10.20.     Plan:  Start Wellbutrin SR 150 mg q.a.m..  Trazodone 100 mg nightly  p.r.n. for insomnia.  Vistaril p.r.n. for anxiety.    . 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 Nausea without vomiting 09/13/2017  . Rectal bleeding 09/13/2017  . Uncomplicated alcohol dependence (CMD) 04/20/2016   Last Assessment & Plan:   Patient was placed on a CIWA protocol for alcohol withdrawal  stabilization.  Discussed naltrexone, patient voiced some interest in medication states that he will "think about it ".  Patient without any significant withdrawal signs or symptoms at time of evaluation.  Patient had previously done well in AA.  Patient encouraged to attend groups.  Also discussed IOS options.    History reviewed. No pertinent surgical history. Family History  Problem Relation Name Age of Onset  . Cirrhosis Mother         died 69  . Heart attack Father         died at 9   Social History   Tobacco Use  . Smoking status: Former  . Smokeless tobacco: Never  Vaping Use  . Vaping status: Never Used  Substance Use Topics  . Alcohol use: Not Currently  . Drug use: Not Currently    Types: Marijuana    Comment: MJ Gummies, stopped 4 years ago     Allergies: Shellfish containing products  IMMUNIZATIONS/ HEALTH STATUS    Immunization History  Administered Date(s) Administered  . Pfizer SARS-CoV-2 Primary Series 12+ yrs 04/17/2020     Health Maintenance Status       Date Due Completion Dates   Comprehensive Annual Visit Never done ---   HIV Screening Never done ---   Hepatitis C Screening Never done ---   Diabetes Screening Never done ---   Pneumococcal Vaccine: Pediatrics (0 to 5 years) and At-Risk Patients (6-49 Years) (1 of 2 - PCV) Never done ---   DTaP/Tdap/Td Vaccines (1 - Tdap)  Never done ---   Hepatitis B Vaccines (1 of 3 - 19+ 3-dose series) Never done ---   Influenza Vaccine (1) 03/05/2024 (Originally 04/07/2023) ---   COVID-19 Vaccine (2 - 2024-25 season) 11/28/2024 (Originally 05/08/2023) 04/17/2020   Depression Screening 11/28/2024 11/29/2023   Colorectal Cancer Screening 10/11/2027 10/10/2017, 10/10/2017   ZOSTER VACCINE (1 of 2) 10/28/2028 ---   Adult RSV (60+ Years or Pregnancy) (1 - 1-dose 75+ series) 10/28/2053 ---        Most recent PHQ-2 results: Patient Health Questionnaire-2 Score: 2 (11/29/2023  3:58 PM) Most recent PHQ-9 result:   PHQ-9  Question # 9   Interpretation: PHQ-2 Interpretation: Negative (None-minimal Depression Severity) (11/29/2023  3:58 PM)   Depression Plan: Normal/Negative Screening   ROS  Other systems reviewed and negative. No other complaints.  Objective   Vitals:   11/29/23 1614  BP: (!) 135/91  Pulse: 68  SpO2:     BP Readings from Last 3 Encounters:  11/29/23 (!) 135/91  10/12/23 (!) 166/98  09/12/20 (!) 155/95     Physical Exam: Gen: Very pleasant patient, appears comfortable. HEENT: PERRLA, nares patent, clear oropharynx, neck supple  Cardio: Normal rate, regular rhythm. Normal S1/S2. No appreciable murmurs. Resp: Lungs clear to auscultation bilaterally.  MSK:  No gross abnormality  Skin: Warm and dry.  Neurologic:  No focal deficits. Psych: Answers questions appropriately, cooperative. Appropriate affect.  ASSESSMENT & PLAN   Problem List Items Addressed This Visit     Hypertension   Relevant Medications   amLODIPine  (NORVASC ) 5 mg tablet   Asthma   Relevant Orders   Ambulatory referral to Pulmonology   Other Visit Diagnoses       Need for Tdap vaccination    -  Primary   Relevant Orders   TDAP VACCINE (BOOSTRIX) 7Y+     Need for hepatitis C screening test       Relevant Orders   Hepatitis C Virus (HCV) Antibody Screen With Confirmation     Encounter for screening for human immunodeficiency virus (HIV)       Relevant Orders   HIV Screen with Reflex to Confirmation     Screening for lipid disorders       Relevant Orders   Lipid Panel     Annual physical exam       Relevant Orders   CBC with Differential   Comprehensive Metabolic Panel     Family history of thyroid disease       Relevant Orders   TSH With Reflex To Free T4      Orders Placed This Encounter  Procedures  . TDAP VACCINE (BOOSTRIX) 7Y+  . Hepatitis C Virus (HCV) Antibody Screen With Confirmation  . HIV Screen with Reflex to Confirmation  . CBC with Differential  . Comprehensive Metabolic  Panel  . TSH With Reflex To Free T4  . Lipid Panel  . Ambulatory referral to Pulmonology   Orders Placed This Encounter  Medications  . amLODIPine  (NORVASC ) 5 mg tablet    Sig: Take 1 tablet (5 mg total) by mouth daily.    Dispense:  90 tablet    Refill:  1   Assessment & Plan 1. Encounter to establish care (Primary) Reviewed medical, family history, surgeries, allergies, medications. Patient will return in 2 weeks for CPE with fasting labs prior.  2. Primary hypertension His blood pressure remains elevated despite current treatment with hydrochlorothiazide  25 mg. The goal is to maintain his blood pressure below 140/90. He  will be transitioned to 5 mg amlodipine  daily for compliance reasons. He has been advised to monitor his blood pressure at home and ensure adequate hydration. If he experiences unusual ankle swelling, a known side effect of amlodipine , he should inform us  promptly so that an alternative medication can be considered. Will f/u in 2 weeks for CPE and recheck blood pressure readings. Will continue to follow. - amLODIPine  (NORVASC ) 5 mg tablet; Take 1 tablet (5 mg total) by mouth daily.  Dispense: 90 tablet; Refill: 1  3. Asthma, unspecified asthma severity, unspecified whether complicated, unspecified whether persistent His asthma is not well controlled, necessitating further evaluation by a pulmonologist. A referral to a pulmonologist has been initiated. He has been instructed to inform us  if he exhausts his current supply of albuterol  inhaler. - Ambulatory referral to Pulmonology; Future  4. Need for Tdap vaccination Given today. - TDAP VACCINE (BOOSTRIX) 7Y+  5. Need for hepatitis C screening test - Hepatitis C Virus (HCV) Antibody Screen With Confirmation; Future  6. Encounter for screening for human immunodeficiency virus (HIV) - HIV Screen with Reflex to Confirmation; Future  7. Screening for lipid disorders - Lipid Panel; Future  8. Annual physical  exam Will return in 2 weeks to complete CPE. - CBC with Differential; Future - Comprehensive Metabolic Panel; Future  9. Family history of thyroid disease - TSH With Reflex To Free T4; Future.  Follow up 2 weeks for CPE  with fasting labs prior, sooner if needed. Patient verbalized agreement to above plan.  30 minutes was spent reviewing and interpreting prior notes/images/labs, counseling the patient, transmitting prescriptions and arranging follow up.   This note was dictated with voice recognition software. Similar sounding words may be inadvertently transcribed incorrectly. Note partially generated using DAX software. Pt consent obtained prior to use.    Mliss Jenkins Minors, NP
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
## 2024-05-31 ENCOUNTER — Other Ambulatory Visit: Payer: Self-pay

## 2024-05-31 ENCOUNTER — Encounter: Payer: Self-pay | Admitting: Physician Assistant

## 2024-05-31 ENCOUNTER — Ambulatory Visit (INDEPENDENT_AMBULATORY_CARE_PROVIDER_SITE_OTHER): Admitting: Physician Assistant

## 2024-05-31 DIAGNOSIS — M25572 Pain in left ankle and joints of left foot: Secondary | ICD-10-CM

## 2024-05-31 DIAGNOSIS — I10 Essential (primary) hypertension: Secondary | ICD-10-CM

## 2024-05-31 DIAGNOSIS — S93401A Sprain of unspecified ligament of right ankle, initial encounter: Secondary | ICD-10-CM

## 2024-05-31 MED ORDER — AMLODIPINE BESYLATE 5 MG PO TABS: 5.0000 mg | ORAL_TABLET | Freq: Every day | ORAL | 1 refills | Status: AC

## 2024-05-31 NOTE — Progress Notes (Signed)
 Office Visit Note   Patient: Derek Ingram           Date of Birth: 09/21/1980           MRN: [id number] Visit Date: 05/31/2024              Requested by: No referring provider defined for this encounter. PCP: Patient, No Pcp Per  Chief Complaint  Patient presents with   Left Ankle - Pain      HPI: 43 y/o male He jumped off a work truck and twisted his ankle.  He was seen at urgent care and given a cam boot for weight bearing.  He was instructed to follow up with us  for exam and further recommendations.   I reviewed his last PCP visit and he is noted to have HTN.  On his lats visit he was not compliant with hydrochlorothiazide  and was changed to Norvasc .  He states he has not filled the prescription yet.  He does have a BP cuff at home, but does not check it regularly.  He has evidence of CKD with a Cr 1.33 on 12/09/23.  Assessment & Plan: Visit Diagnoses:  1. Pain in left ankle and joints of left foot   2. Hypertension, unspecified type   3. Sprain of unspecified ligament of right ankle, initial encounter     Plan: Cam boot WBAT.  Elevation to decrease swelling and decrease pain, he was given an elevation handout.  He was given Hydrocodone  PRN.  I suggested he use tylenol  after that with elevation and ice PRN.  Out of work 2 weeks until f/u.    After reviewing his chart I noted he has HTN and  is not taking his medication.  His lab work show Cr elevation with CKD.  He was not aware of the kidney disease.  I sent a prescription for Norvasc  5 mg and asked him to follow up wit his PCP.  He said he would and he would pick up the medicine today.    Follow-Up Instructions: Return in about 2 weeks (around 06/14/2024).   Ortho Exam  Patient is alert, oriented, no adenopathy, well-dressed, normal affect, normal respiratory effort. Left LE edema into the ankle and foot.  Tenderness to medial and lateral malleolus.  No tenderness to palpation over the metatarsals or phalanges.   Palpable pedal pulses.  Very guarded active range of motion.  Sensation grossly intact.     Imaging: No ankle fracture mortis is intact.  Medial malleolus osseus, does not appear to be an avulsion fracture.  I compared it to previous left foot x rays from 2020 and the osseus was there at the time.    Labs: Lab Results  Component Value Date   HGBA1C 6.3 (H) 02/11/2012   REPTSTATUS 04/21/2019 FINAL 04/19/2019   GRAMSTAIN  01/22/2012    NO WBC SEEN NO SQUAMOUS EPITHELIAL CELLS SEEN MODERATE GRAM NEGATIVE RODS FEW GRAM POSITIVE COCCI IN PAIRS   CULT  04/19/2019    NO GROWTH Performed at [facility], [address] [place], [place] [address]    LABORGA PSEUDOMONAS AERUGINOSA 01/22/2012     Lab Results  Component Value Date   ALBUMIN 4.3 04/19/2019   ALBUMIN 4.6 03/18/2017   ALBUMIN 4.4 01/24/2017    No results found for: MG No results found for: VD25OH  No results found for: PREALBUMIN    Latest Ref Rng & Units 04/19/2019    9:43 PM 03/18/2017  6:35 AM 01/24/2017    6:13 PM  CBC EXTENDED  WBC 4.0 - 10.5 K/uL 7.3  6.2  6.5   RBC 4.22 - 5.81 MIL/uL 4.68  4.80  4.40   Hemoglobin 13.0 - 17.0 g/dL 87.0  85.9  87.5   HCT 39.0 - 52.0 % 42.0  41.3  38.4   Platelets 150 - 400 K/uL 215  208  215   NEUT# 1.7 - 7.7 K/uL 4.5     Lymph# 0.7 - 4.0 K/uL 1.9        There is no height or weight on file to calculate BMI.  Orders:  Orders Placed This Encounter  Procedures   XR Ankle Complete Left   Meds ordered this encounter  Medications   amLODipine  (NORVASC ) 5 MG tablet    Sig: Take 1 tablet (5 mg total) by mouth daily.    Dispense:  30 tablet    Refill:  1    Supervising Provider:   DUDA, MARCUS V [[id number]]     Procedures: No procedures performed  Clinical Data: No additional findings.  ROS:  All other systems negative, except as noted in the HPI. Review of Systems  Objective: Vital Signs: There were no vitals taken for this visit.  Specialty  Comments:  No specialty comments available.  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   Major depressive disorder 04/20/2016   Past Medical History:  Diagnosis Date   Asthma    GERD (gastroesophageal reflux disease)    Hyperlipemia    Hypertension     Family History  Problem Relation Age of Onset   Liver disease Mother    Coronary artery disease Father    Colon cancer Neg Hx     Past Surgical History:  Procedure Laterality Date   FRACTURE SURGERY     left hand   UPPER GASTROINTESTINAL ENDOSCOPY     Social History   Occupational History   Not on file  Tobacco Use   Smoking status: Former    Types: Cigars    Quit date: 09/07/2015    Years since quitting: 8.7   Smokeless tobacco: Never  Vaping Use   Vaping status: Never Used  Substance and Sexual Activity   Alcohol use: No   Drug use: No   Sexual activity: Not on file

## 2024-06-14 ENCOUNTER — Other Ambulatory Visit: Payer: Self-pay

## 2024-06-14 ENCOUNTER — Encounter: Payer: Self-pay | Admitting: Physician Assistant

## 2024-06-14 ENCOUNTER — Ambulatory Visit: Admitting: Physician Assistant

## 2024-06-14 DIAGNOSIS — S93402A Sprain of unspecified ligament of left ankle, initial encounter: Secondary | ICD-10-CM | POA: Diagnosis not present

## 2024-06-14 DIAGNOSIS — S93401A Sprain of unspecified ligament of right ankle, initial encounter: Secondary | ICD-10-CM

## 2024-06-14 DIAGNOSIS — M25572 Pain in left ankle and joints of left foot: Secondary | ICD-10-CM

## 2024-06-14 NOTE — Progress Notes (Signed)
 Office Visit Note   Patient: Derek Ingram           Date of Birth: 1981-05-08           MRN: [id number] Visit Date: 06/14/2024              Requested by: No referring provider defined for this encounter. PCP: Patient, No Pcp Per  Chief Complaint  Patient presents with   Left Ankle - Follow-up      HPI: 43 y/o male He jumped off a work truck and twisted his ankle.  He was seen at urgent care and given a cam boot for weight bearing.  He was instructed to follow up with us  for exam and further recommendations.              I reviewed his last PCP visit and he is noted to have HTN.  On his lats visit he was not compliant with hydrochlorothiazide  and was changed to Norvasc .  He states he has not filled the prescription yet.  He does have a BP cuff at home, but does not check it regularly.  He has evidence of CKD with a Cr 1.33 on 12/09/23.  On his last visit he was WBAT in the cam boot. Elevation to decrease swelling and decrease pain, he was given an elevation handout.  He was given Hydrocodone  PRN.  I suggested he use tylenol  after that with elevation and ice PRN.  Out of work 2 weeks until f/u.               After reviewing his chart I noted he has HTN and  is not taking his medication.  His lab work show Cr elevation with CKD.  He was not aware of the kidney disease.  I sent a prescription for Norvasc  5 mg and asked him to follow up wit his PCP.  He said he would and he would pick up the medicine today.    He states on Tuesday 06/12/24 he was getting out of the tub/shower and slipped and hit the ankle again.  His pain increased and he started to keep his wait off the ankle until he returned today.  Out of work until f/u in 2 weeks.  He states there is no light duty at his work.    Assessment & Plan: Visit Diagnoses:  1. Pain in left ankle and joints of left foot   2. Sprain of unspecified ligament of right ankle, initial encounter     Plan: WBAT in the cam boot.  Gentle active  ROM of the ankle when at rest.  ABC's.  Ice PRN and elevation if edema occurs.    Follow-Up Instructions: No follow-ups on file.   Ortho Exam  Patient is alert, oriented, no adenopathy, well-dressed, normal affect, normal respiratory effort. Palpable pedal pulses.  Minimal edema.  Tenderness over the tibia down to the medial malleolus.  No cellulitis.  Non tender over the anterior ankle or the lateral malleolus.  Negative drawer test.      Imaging: Mortis intact.  No fracture note medial malleolus or the tibia.    Labs: Lab Results  Component Value Date   HGBA1C 6.3 (H) 02/11/2012   REPTSTATUS 04/21/2019 FINAL 04/19/2019   GRAMSTAIN  01/22/2012    NO WBC SEEN NO SQUAMOUS EPITHELIAL CELLS SEEN MODERATE GRAM NEGATIVE RODS FEW GRAM POSITIVE COCCI IN PAIRS   CULT  04/19/2019    NO GROWTH Performed at [facility],  [address], [place], [place] [address]    LABORGA PSEUDOMONAS AERUGINOSA 01/22/2012     Lab Results  Component Value Date   ALBUMIN 4.3 04/19/2019   ALBUMIN 4.6 03/18/2017   ALBUMIN 4.4 01/24/2017    No results found for: MG No results found for: VD25OH  No results found for: PREALBUMIN    Latest Ref Rng & Units 04/19/2019    9:43 PM 03/18/2017    6:35 AM 01/24/2017    6:13 PM  CBC EXTENDED  WBC 4.0 - 10.5 K/uL 7.3  6.2  6.5   RBC 4.22 - 5.81 MIL/uL 4.68  4.80  4.40   Hemoglobin 13.0 - 17.0 g/dL 87.0  85.9  87.5   HCT 39.0 - 52.0 % 42.0  41.3  38.4   Platelets 150 - 400 K/uL 215  208  215   NEUT# 1.7 - 7.7 K/uL 4.5     Lymph# 0.7 - 4.0 K/uL 1.9        There is no height or weight on file to calculate BMI.  Orders:  Orders Placed This Encounter  Procedures   XR Ankle 2 Views Left   No orders of the defined types were placed in this encounter.    Procedures: No procedures performed  Clinical Data: No additional findings.  ROS:  All other systems negative, except as noted in the HPI. Review of  Systems  Objective: Vital Signs: There were no vitals taken for this visit.  Specialty Comments:  No specialty comments available.  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   Major depressive disorder 04/20/2016   Past Medical History:  Diagnosis Date   Asthma    GERD (gastroesophageal reflux disease)    Hyperlipemia    Hypertension     Family History  Problem Relation Age of Onset   Liver disease Mother    Coronary artery disease Father    Colon cancer Neg Hx     Past Surgical History:  Procedure Laterality Date   FRACTURE SURGERY     left hand   UPPER GASTROINTESTINAL ENDOSCOPY     Social History   Occupational History   Not on file  Tobacco Use   Smoking status: Former    Types: Cigars    Quit date: 09/07/2015    Years since quitting: 8.7   Smokeless tobacco: Never  Vaping Use   Vaping status: Never Used  Substance and Sexual Activity   Alcohol use: No   Drug use: No   Sexual activity: Not on file

## 2024-06-28 ENCOUNTER — Ambulatory Visit (INDEPENDENT_AMBULATORY_CARE_PROVIDER_SITE_OTHER): Admitting: Physician Assistant

## 2024-06-28 ENCOUNTER — Encounter: Payer: Self-pay | Admitting: Physician Assistant

## 2024-06-28 DIAGNOSIS — S93401A Sprain of unspecified ligament of right ankle, initial encounter: Secondary | ICD-10-CM | POA: Diagnosis not present

## 2024-06-28 NOTE — Progress Notes (Signed)
 Office Visit Note   Patient: Derek Ingram           Date of Birth: 24-Apr-1981           MRN: [id number] Visit Date: 06/28/2024              Requested by: No referring provider defined for this encounter. PCP: Patient, No Pcp Per  Chief Complaint  Patient presents with   Left Ankle - Follow-up      HPI: 43 y/o male He jumped off a work truck and twisted his ankle. He was seen at urgent care and given a cam boot for weight bearing.  On the lat visit he was instructed to do active ankle motion out of the boot, RICE.  He started walking around his house without the boot for the past few days.  He states he has no edema and his pain has subsided.  At the end of the day yesterday he did a little mor and had mild aching.  The pain resolved over night.  He is pleased with his recovery.  He has had a follow up visit with his PCP and his BP is better controlled.    Assessment & Plan: Visit Diagnoses:  1. Sprain of unspecified ligament of right ankle, initial encounter     Plan: He will continue to increase his activity , wear a supportive shoe.  He will return to work full duty on 07/02/24.  He wears ankle high boots that are lace up.  I recommend he start a walking program as tolerates.      Follow-Up Instructions: Return if symptoms worsen or fail to improve.   Ortho Exam  Patient is alert, oriented, no adenopathy, well-dressed, normal affect, normal respiratory effort. Palpable pedal pulses. No edema noted today.Slight tenderness over the tibia tendon behind the medial malleolus down. No cellulitis. Non tender over the anterior ankle or the lateral malleolus. Negative drawer test.  Full active ROM with slight discomfort with active ankle supination.      Imaging: No results found. No images are attached to the encounter.  Labs: Lab Results  Component Value Date   HGBA1C 6.3 (H) 02/11/2012   REPTSTATUS 04/21/2019 FINAL 04/19/2019   GRAMSTAIN  01/22/2012    NO WBC  SEEN NO SQUAMOUS EPITHELIAL CELLS SEEN MODERATE GRAM NEGATIVE RODS FEW GRAM POSITIVE COCCI IN PAIRS   CULT  04/19/2019    NO GROWTH Performed at [facility], [address] [place], [place] [address]    LABORGA PSEUDOMONAS AERUGINOSA 01/22/2012     Lab Results  Component Value Date   ALBUMIN 4.3 04/19/2019   ALBUMIN 4.6 03/18/2017   ALBUMIN 4.4 01/24/2017    No results found for: MG No results found for: VD25OH  No results found for: PREALBUMIN    Latest Ref Rng & Units 04/19/2019    9:43 PM 03/18/2017    6:35 AM 01/24/2017    6:13 PM  CBC EXTENDED  WBC 4.0 - 10.5 K/uL 7.3  6.2  6.5   RBC 4.22 - 5.81 MIL/uL 4.68  4.80  4.40   Hemoglobin 13.0 - 17.0 g/dL 87.0  85.9  87.5   HCT 39.0 - 52.0 % 42.0  41.3  38.4   Platelets 150 - 400 K/uL 215  208  215   NEUT# 1.7 - 7.7 K/uL 4.5     Lymph# 0.7 - 4.0 K/uL 1.9        There is no height or  weight on file to calculate BMI.  Orders:  No orders of the defined types were placed in this encounter.  No orders of the defined types were placed in this encounter.    Procedures: No procedures performed  Clinical Data: No additional findings.  ROS:  All other systems negative, except as noted in the HPI. Review of Systems  Objective: Vital Signs: There were no vitals taken for this visit.  Specialty Comments:  No specialty comments available.  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   Major depressive disorder 04/20/2016   Past Medical History:  Diagnosis Date   Asthma    GERD (gastroesophageal reflux disease)    Hyperlipemia    Hypertension     Family  History  Problem Relation Age of Onset   Liver disease Mother    Coronary artery disease Father    Colon cancer Neg Hx     Past Surgical History:  Procedure Laterality Date   FRACTURE SURGERY     left hand   UPPER GASTROINTESTINAL ENDOSCOPY     Social History   Occupational History   Not on file  Tobacco Use   Smoking status: Former    Types: Cigars    Quit date: 09/07/2015    Years since quitting: 8.8   Smokeless tobacco: Never  Vaping Use   Vaping status: Never Used  Substance and Sexual Activity   Alcohol use: No   Drug use: No   Sexual activity: Not on file

## 2024-07-09 ENCOUNTER — Encounter: Payer: Self-pay | Admitting: Radiology

## 2024-07-27 NOTE — Progress Notes (Signed)
 Name: Derek Ingram Date of visit: 07/27/24  Chief Complaint   Chief Complaint  Patient presents with  . Annual Exam    Subjective  Derek Ingram is a 43 y.o. male who presents today at [facility] for annual exam.   History of Present Illness The patient presents for a physical exam.  He reports no symptoms of depression or anxiety. He was previously on a regimen of hydrochlorothiazide  and baby aspirin from 2001 to 2011 but has not continued these medications since his release from incarceration. He has a history of smoking but has abstained from alcohol for the past 9 years. He reports no new episodes of dizziness, vision changes, or syncope. He also reports no new chest pain, shortness of breath, or swelling in his feet or hands. He has a history of GERD, which he managed with Tums, and reports daily episodes of stomach burning. He has undergone both endoscopy and colonoscopy in the past. He has a history of ear problems since the age of 67, including wax buildup and occasional discharge, but reports no current pain. He is currently under the care of a dentist and reports no difficulty swallowing. He has a history of elevated creatinine levels and was previously evaluated for lymph node cancer. He has a history of Achilles tendon surgery 3 years ago.  He reports a decline in his visual acuity over the past year, necessitating the use of prescription glasses. He experiences difficulty seeing without his glasses and struggles to see lines in low light or rainy conditions.  He reports persistent issues with his left hand, which he is unable to fully close due to a sensation of his left thumb being stuck. This issue has been ongoing for approximately one month. He experiences pain when attempting to loosen the top part of his hand and describes a feeling akin to a band around his hand. He has not sought physical therapy for this issue. He was seen and evaluated in June  2025 by orthopedist and received an injection at that visit. He did not follow up as suggested.  Occupation: Shipping and receiving Alcohol: The patient has abstained from alcohol for the past 9 years. Tobacco: The patient has a history of smoking.  PAST SURGICAL HISTORY: Achilles tendon surgery (3 years ago) Endoscopy Colonoscopy  Behavioral Health Screening  Patient Health Questionnaire-2 Score: 0 (07/27/2024  8:49 AM)      Patient's Depression screening is Negative    Current Outpatient Medications  Medication Instructions  . amLODIPine  (NORVASC ) 5 mg, oral, Daily  . aspirin 81 mg, oral, Daily  . bacitracin zinc-polymyxin B (POLYSPORIN) 500-10,000 unit/gram ointment 1 Application, topical, 2 times daily    PAST MEDICAL, SOCIAL & FAMILY HISTORY:   Medical History[1] Surgical History[2] Family History[3] Social History[4]   Allergies: Shellfish containing products  IMMUNIZATIONS/ HEALTH STATUS    Immunization History  Administered Date(s) Administered  . HPV 9-Valent 07/27/2024  . Hep B-CpG (HEPLISAV-B) 18Y+, non-pregnant 07/27/2024  . Influenza,split virus, trivalent, PF 06/15/2024  . Pfizer SARS-CoV-2 Primary Series 12+ yrs 04/17/2020  . TDAP VACCINE (BOOSTRIX,ADACEL) 7Y+ 11/29/2023   Health Maintenance Status       Date Due Completion Dates   Comprehensive Annual Visit Never done ---   Pneumococcal Vaccine: Pediatrics (0 to 5 years) and At-Risk Patients (6-49 Years) (1 of 2 - PCV) Never done ---   HPV Vaccines (2 - 3-dose SCDM series) 08/24/2024 07/27/2024   COVID-19 Vaccine (2 - 2025-26  season) 07/27/2025 (Originally 05/07/2024) 04/17/2020   Hepatitis B Vaccines (2 of 2 - CpG 2-dose series) 08/24/2024 07/27/2024   Diabetes Screening 12/08/2024 12/09/2023   Depression Screening 07/27/2025 07/27/2024   Colorectal Cancer Screening 10/11/2027 10/10/2017, 10/10/2017   DTaP/Tdap/Td Vaccines (2 - Td or Tdap) 11/28/2033 11/29/2023   Adult RSV (50+ Years or Pregnancy) (1 -  1-dose 75+ series) 10/28/2053 ---      Health Maintenance:  - (>18) Depression screen: The patient denies any present symptoms of depression or anxiety. - (>35) Lipid disorders screening: yes - up to date with PCP. - (40-59) Aspirin to prevent CVD: The 10-year ASCVD risk score (Arnett DK, et al., 2019) is: 7.8%   Values used to calculate the score:     Age: 42 years     Clinically relevant sex: Male     Is Non-Hispanic African American: Yes     Diabetic: No     Tobacco smoker: No     Systolic Blood Pressure: 138 mmHg     Is BP treated: Yes     HDL Cholesterol: 46 mg/dL     Total Cholesterol: 183 mg/dL , The patient will be treated with aspirin 81 mg daily - Colon cancer screening: Normal colonoscopy in year: 2019, repeat in 2029 - Lung cancer screening: Not of appropriate age.   Once-lifetime HIV & HepC: HIV & Hep-C screening completed 12/09/2023  Vaccines:  Immunization History  Administered Date(s) Administered  . HPV 9-Valent 07/27/2024  . Hep B-CpG (HEPLISAV-B) 18Y+, non-pregnant 07/27/2024  . Influenza,split virus, trivalent, PF 06/15/2024  . Pfizer SARS-CoV-2 Primary Series 12+ yrs 04/17/2020  . TDAP VACCINE (BOOSTRIX,ADACEL) 7Y+ 11/29/2023   - Tdap: up-to-date. - Influenza vaccine: Up-to-date. advised to get vaccine during influenza season Oct-May. - (>65, 19-65 DM/COPD, Ast/CHF) Pneumonia vaccine: N/A.  ROS  Other systems reviewed and negative. No other complaints.  Objective   Vitals:   07/27/24 0849  BP: 138/88  Pulse: 79  SpO2: 100%     Complete Physical Exam Vitals and nursing note reviewed Chaperone:N/A Constitutional:      Appearance: Normal appearance.  HENT:     Head: Normocephalic.     Right Ear: Tympanic membrane, ear canal reddened and irritated and external ear normal.     Left Ear: Tympanic membrane, ear canal reddened, irritated and external ear normal.     Ears: stated decreased hearing on left    Mouth/Throat:     Mouth: Mucous  membranes are moist.     Pharynx: Oropharynx is clear. No oropharyngeal exudate    Comments: missing some bottom back right teeth Eyes:     Extraocular Movements: Extraocular movements intact.     Conjunctiva/sclera: Conjunctivae normal.     Pupils: Pupils are equal, round, and reactive to light.  Neck:     Vascular: No carotid bruit.     Thyroid: No nodules/masses/tenderness Cardiovascular:     Rate and Rhythm: Normal rate and regular rhythm.     Pulses: Normal pulses.     Heart sounds: Normal heart sounds.  Pulmonary:     Effort: Pulmonary effort is normal.     Breath sounds: Normal breath sounds.  Abdominal:     General: Bowel sounds are normal. There is no distension. There is no hepatomegaly, no splenomegaly    Palpations: Abdomen is soft. There is no mass.     Tenderness: There is no abdominal tenderness.  Genitourinary:     Declined Musculoskeletal:        General:  No deformity. Normal range of motion.     Cervical back: Neck supple.     Right lower leg: No edema.     Left lower leg: No edema.     Comments: left thumb trigger finger Lymphadenopathy:     Cervical: No cervical adenopathy.  Skin:    General: Skin is warm and dry.     Comments:  Neurological:     General: No focal deficit present.     Mental Status: Alert and oriented to person, place, and time.     Cranial Nerves: CNII-XII grossly intact    Motor: No weakness.     Deep Tendon Reflexes: Reflexes normal.  Psychiatric:        Mood and Affect: Mood normal.        Behavior: Behavior normal.   ASSESSMENT & PLAN   Problem List Items Addressed This Visit     Hypertension   Trigger finger of left thumb   Elevated LDL cholesterol level   Relevant Medications   aspirin 81 mg chewable tablet   Other Visit Diagnoses       Encounter for annual health examination    -  Primary     Elevated serum creatinine       Relevant Orders   Comprehensive Metabolic Panel     Need for HPV vaccination        Relevant Orders   HPV 9 Valent (GARDASIL) (Completed)     Need for hepatitis B booster vaccination       Relevant Orders   Hep B-CpG (HEPLISAV-B) 18Y+, non-pregnant (Completed)     Irritation of both external auditory canals       Relevant Medications   bacitracin zinc-polymyxin B (POLYSPORIN) 500-10,000 unit/gram ointment     Health care maintenance          Orders Placed This Encounter  Procedures  . HPV 9 Valent (GARDASIL)  . Hep B-CpG (HEPLISAV-B) 18Y+, non-pregnant  . Comprehensive Metabolic Panel   Orders Placed This Encounter  Medications  . aspirin 81 mg chewable tablet    Sig: Chew 1 tablet (81 mg total) daily.    Dispense:  90 tablet    Refill:  3  . bacitracin zinc-polymyxin B (POLYSPORIN) 500-10,000 unit/gram ointment    Sig: Apply 1 Application topically 2 (two) times a day for 10 days.    Dispense:  20 g    Refill:  0   Assessment & Plan 1. Encounter for annual health examination (Primary) - Age appropriate Health Maintenance reviewed and updated accordingly - Routine blood work obtained previously, repeat CMP for elevated creatinine levels. - Encourage maintenance of healthy diet including emphasis on intake of vegetables, fruits, legumes, nuts, whole grains and fish.  Sodium intake should be less than 2300 mg/day, and avoiding trans fat entirely - Daily physical activity as tolerated, 150 minutes/week of moderate intensity  exercise such as brisk walking   2. Elevated serum creatinine - Comprehensive Metabolic Panel; Future - Comprehensive Metabolic Panel  3. Elevated LDL cholesterol level - The 10-year ASCVD risk score (Arnett DK, et al., 2019) is: 7.8%   Values used to calculate the score:     Age: 9 years     Clinically relevant sex: Male     Is Non-Hispanic African American: Yes     Diabetic: No     Tobacco smoker: No     Systolic Blood Pressure: 138 mmHg     Is BP treated: Yes  HDL Cholesterol: 46 mg/dL     Total Cholesterol: 183 mg/dL -  aspirin 81 mg chewable tablet; Chew 1 tablet (81 mg total) daily.  Dispense: 90 tablet; Refill: 3  4. Need for HPV vaccination - HPV 9 Valent (GARDASIL)  5. Need for hepatitis B booster vaccination - Hep B-CpG (HEPLISAV-B) 18Y+, non-pregnant  6. Irritation of both external auditory canals - Bilateral ear canals reddened, itchy for months. States sometimes fluid will come from ear canal. - Will try topical ointment for 10 days to see if this helps. - Will follow as needed - bacitracin zinc-polymyxin B (POLYSPORIN) 500-10,000 unit/gram ointment; Apply 1 Application topically 2 (two) times a day for 10 days.  Dispense: 20 g; Refill: 0  7. Primary hypertension BP Readings from Last 3 Encounters:  07/27/24 138/88  06/15/24 138/84  02/23/24 127/81  - Blood pressure is well-controlled on amlodipine  5 mg daily, with a current reading of 138/88. - Continue taking amlodipine  daily and maintain a healthy lifestyle, including regular exercise and a balanced diet - Will follow.  8. Trigger finger of left thumb - The left thumb is not closing and keeps sticking, causing pain and difficulty with daily activities. - Seen in 02/2024 by orthopedist in [place], given injection. Did not follow up in 6 months. Would prefer to stay closer to home and suggested he be seen and evaluated by Dr. Raymond in this office. - Advised to make f/u appointment at checkout.  9. Health care maintenance - Lipid profile is within normal limits, with a slight elevation in LDL cholesterol. The 10-year ASCVD risk score is 7.8% - advised 81 mg baby aspirin. Creatinine level is marginally elevated at 1.33. BMI is currently 41. - Advised to maintain a healthy lifestyle, including regular exercise and a balanced diet, and to aim for a BMI below 30. Monthly self-examinations of testicles are recommended. - Prescription for baby aspirin 81 mg daily will be provided to prevent cardiovascular disease. A repeat CMP will be ordered to  monitor creatinine levels. HPV and hepatitis B vaccines will be administered today.  Follow up, 1 month [place] for vaccines,  6 months for HTN & vaccines mo, sooner if needed. Patient verbalized agreement to above plan.  30 minutes was spent reviewing and interpreting prior notes/images/labs, counseling the patient, transmitting prescriptions and arranging follow up.    This note was dictated with voice recognition software. Similar sounding words may be inadvertently transcribed incorrectly. Note partially generated using DAX software. Pt consent obtained prior to use.    Mliss Jenkins Minors, NP        [1] Past Medical History: Diagnosis Date  . Abdominal pain, epigastric 09/13/2017  . Allergic   . Asthma (CMD)   . Hypertension   . 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  Alcohol use disorder, severe, chronic - F 10.20.     Plan:  Start Wellbutrin SR 150 mg q.a.m..  Trazodone 100 mg nightly  p.r.n. for insomnia.  Vistaril p.r.n. for anxiety.    . Major depressive disorder, recurrent severe without psychotic features    (CMD) 04/20/2016  . Nausea without vomiting 09/13/2017  . Rectal bleeding 09/13/2017  . Uncomplicated alcohol dependence    (CMD) 04/20/2016   Last Assessment & Plan:   Patient was placed on a CIWA protocol for alcohol withdrawal stabilization.  Discussed naltrexone, patient voiced some interest in medication  states that he will "think about it ".  Patient without any significant withdrawal signs or symptoms at time of evaluation.  Patient had previously done well in AA.  Patient encouraged to attend groups.  Also discussed IOS options.   [2] History reviewed. No pertinent surgical history. [3] Family History Problem Relation Name Age of Onset  . Cirrhosis Mother         died 45  . Heart attack Father         died at 34  . Diabetes Sister     [4] Social History Tobacco Use  . Smoking status: Former    Current packs/day: 0.25    Average packs/day: 0.3 packs/day for 5.0 years (1.3 ttl pk-yrs)    Types: Cigarettes  . Smokeless tobacco: Never  Vaping Use  . Vaping status: Never Used  Substance Use Topics  . Alcohol use: Not Currently  . Drug use: Not Currently    Types: Marijuana    Comment: MJ Gummies, stopped 4 years ago

## 2024-07-30 ENCOUNTER — Emergency Department (HOSPITAL_COMMUNITY)
Admission: EM | Admit: 2024-07-30 | Discharge: 2024-07-30 | Disposition: A | Discharge status: Home / Self Care | Attending: Emergency Medicine | Admitting: Emergency Medicine

## 2024-07-30 ENCOUNTER — Other Ambulatory Visit: Payer: Self-pay

## 2024-07-30 ENCOUNTER — Emergency Department (HOSPITAL_COMMUNITY)

## 2024-07-30 ENCOUNTER — Encounter (HOSPITAL_COMMUNITY): Payer: Self-pay

## 2024-07-30 DIAGNOSIS — X503XXA Overexertion from repetitive movements, initial encounter: Secondary | ICD-10-CM | POA: Diagnosis not present

## 2024-07-30 DIAGNOSIS — Y990 Civilian activity done for income or pay: Secondary | ICD-10-CM | POA: Insufficient documentation

## 2024-07-30 DIAGNOSIS — S39012A Strain of muscle, fascia and tendon of lower back, initial encounter: Secondary | ICD-10-CM | POA: Diagnosis not present

## 2024-07-30 DIAGNOSIS — I10 Essential (primary) hypertension: Secondary | ICD-10-CM | POA: Insufficient documentation

## 2024-07-30 DIAGNOSIS — S3992XA Unspecified injury of lower back, initial encounter: Secondary | ICD-10-CM | POA: Diagnosis present

## 2024-07-30 MED ORDER — PREDNISONE 20 MG PO TABS: 40.0000 mg | ORAL_TABLET | Freq: Every day | ORAL | 0 refills | Status: AC

## 2024-07-30 MED ORDER — ACETAMINOPHEN 500 MG PO TABS
1000.0000 mg | ORAL_TABLET | Freq: Once | ORAL | Status: AC
  Administered 2024-07-30: 1000 mg via ORAL
  Filled 2024-07-30: qty 2

## 2024-07-30 MED ORDER — KETOROLAC TROMETHAMINE 15 MG/ML IJ SOLN
15.0000 mg | Freq: Once | INTRAMUSCULAR | Status: AC
  Administered 2024-07-30: 15 mg via INTRAMUSCULAR
  Filled 2024-07-30: qty 1

## 2024-07-30 MED ORDER — CYCLOBENZAPRINE HCL 10 MG PO TABS: 10.0000 mg | ORAL_TABLET | Freq: Two times a day (BID) | ORAL | 0 refills | Status: AC | PRN

## 2024-07-30 MED ORDER — HYDROCODONE-ACETAMINOPHEN 5-325 MG PO TABS: 1.0000 | ORAL_TABLET | Freq: Four times a day (QID) | ORAL | 0 refills | Status: AC | PRN

## 2024-07-30 MED ORDER — NAPROXEN 500 MG PO TABS: 500.0000 mg | ORAL_TABLET | Freq: Two times a day (BID) | ORAL | 0 refills | Status: AC

## 2024-07-30 MED ORDER — OXYCODONE HCL 5 MG PO TABS
5.0000 mg | ORAL_TABLET | Freq: Once | ORAL | Status: AC
Start: 2024-07-30 — End: 2024-07-30
  Administered 2024-07-30: 5 mg via ORAL
  Filled 2024-07-30: qty 1

## 2024-07-30 MED ORDER — LIDOCAINE 5 % EX PTCH
2.0000 | MEDICATED_PATCH | Freq: Once | CUTANEOUS | Status: DC
Start: 2024-07-30 — End: 2024-07-31
  Administered 2024-07-30: 2 via TRANSDERMAL
  Filled 2024-07-30: qty 2

## 2024-07-30 NOTE — ED Triage Notes (Signed)
 PT arrives via POV. Pt reports ongoing back pain for over a week despite taking meds that he was prescribed from an UC. PT denies injury. No loss of control of bowel or bladder. Thinks he could have pulled a muscle at work.

## 2024-07-30 NOTE — Discharge Instructions (Signed)
 Begin taking prednisone  as prescribed.  May take naproxen  twice a day as needed for pain.  Take Flexeril  twice a day as needed for pain/muscle spasm.  May take Norco every 6 hours as needed for severe pain.  Be careful taking Flexeril  and Norco at the same time as both can make you drowsy.  Apply heat pads to the back to help with pain as well.  Continue to move his arm as tolerated to avoid getting stiff.  Avoid heavy lifting.  Follow-up with PCP in 1 week if no improvement.  Return to ED if any symptoms worsen including severe uncontrollable pain, inability to walk, incontinence, numbness/tingling in the groin.

## 2024-07-30 NOTE — Progress Notes (Signed)
 Repeat of CMP shows a normalization of the creatinine levels Random glucose is just slightly elevated from normal - this is not concerning especially if you have had something to eat or drink in the 6 hours prior to your labs being drawn. The Anion Gap is just slightly decreased and could mean a lab error or analytic artifact. Other major reasons for a low anion gap are not clinically relevant for you such as hypoalbuminemia, paraproteinemia, hypercalcemia or pseudohyponatremia from hyperlipidemia. All these ranges are normal on your labs. I do hope this helps alleviate your concerns!

## 2024-07-30 NOTE — ED Provider Notes (Signed)
 [place] EMERGENCY DEPARTMENT AT [facility] Provider Note   CSN: [id number] Arrival date & time: 07/30/24  1705     Patient presents with: Back Pain   Derek Ingram is a 43 y.o. male.  Patient is a 43 year old male with a history of hypertension who presents to the ED for increasing low back pain for the past week.  Notes he was seen at urgent care 1 week ago and given a muscle relaxer but pain has persisted.  Has also taken Tylenol  with minimal relief.  States he has never had back pain issues like this previously.  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  Pain does not radiate.  Denies incontinence or saddle anesthesias.  He notes he did have lab work that confirmed normal kidney function and no urine infection.  Denies urinary symptoms.  No further complaints.   Back Pain Associated symptoms: no chest pain, no dysuria and no fever        Prior to Admission medications   Medication Sig Start Date End Date Taking? Authorizing Provider  cyclobenzaprine  (FLEXERIL ) 10 MG tablet Take 1 tablet (10 mg total) by mouth 2 (two) times daily as needed for muscle spasms. 07/30/24  Yes Sneha Willig, Thersia RAMAN, PA-C  HYDROcodone -acetaminophen  (NORCO/VICODIN) 5-325 MG tablet Take 1 tablet by mouth every 6 (six) hours as needed for up to 6 doses. 07/30/24  Yes Kendrell Lottman, Thersia RAMAN, PA-C  naproxen  (NAPROSYN ) 500 MG tablet Take 1 tablet (500 mg total) by mouth 2 (two) times daily. 07/30/24  Yes Minta Fair, Thersia RAMAN, PA-C  predniSONE  (DELTASONE ) 20 MG tablet Take 2 tablets (40 mg total) by mouth daily for 5 days. 07/30/24 08/04/24 Yes Senai Ramnath, Thersia RAMAN, PA-C  amLODipine  (NORVASC ) 5 MG tablet Take 1 tablet (5 mg total) by mouth daily. 05/31/24   Gerome Maurilio HERO, PA-C  ibuprofen  (ADVIL ) 600 MG tablet Take 1 tablet (600 mg total) by mouth every 8 (eight) hours as needed. 10/09/23   Fleming, Zelda W, NP  ondansetron  (ZOFRAN -ODT) 4 MG disintegrating tablet Take 1  tablet (4 mg total) by mouth every 6 (six) hours as needed for nausea or vomiting. 10/06/23   Rising, Asberry, PA-C  promethazine -dextromethorphan (PROMETHAZINE -DM) 6.25-15 MG/5ML syrup Take 5 mLs by mouth 4 (four) times daily as needed for cough. 10/06/23   Rising, Asberry, PA-C  pseudoephedrine  (SUDAFED) 30 MG tablet Take 1 tablet (30 mg total) by mouth every 4 (four) hours as needed for congestion. 10/06/23   Rising, Asberry, PA-C    Allergies: Shrimp [shellfish allergy]    Review of Systems  Constitutional:  Negative for chills and fever.  Respiratory:  Negative for shortness of breath.   Cardiovascular:  Negative for chest pain.  Genitourinary:  Negative for dysuria.  Musculoskeletal:  Positive for back pain.  All other systems reviewed and are negative.   Updated Vital Signs BP (!) 142/92   Pulse 79   Temp 98.1 F (36.7 C)   Resp 17   SpO2 99%   Physical Exam Constitutional:      Appearance: Normal appearance.  HENT:     Head: Normocephalic and atraumatic.  Cardiovascular:     Rate and Rhythm: Normal rate.  Pulmonary:     Effort: Pulmonary effort is normal.  Musculoskeletal:        General: Normal range of motion.     Comments: PT pulses 2+ bilaterally.  Full range of motion of bilateral lower extremities  with equal strength.  Tender palpation on the bilateral lower lumbar area.  No deformities, erythema, edema, crepitus.  Skin:    General: Skin is warm and dry.  Neurological:     Mental Status: He is alert and oriented to person, place, and time.  Psychiatric:        Mood and Affect: Mood normal.        Behavior: Behavior normal.     (all labs ordered are listed, but only abnormal results are displayed) Labs Reviewed - No data to display  EKG: None  Radiology: DG Lumbar Spine Complete Result Date: 07/30/2024 EXAM: 4 OR MORE VIEW(S) XRAY OF THE LUMBAR SPINE 07/30/2024 06:42:00 PM COMPARISON: 05/20/2012 CLINICAL HISTORY: acute pain x1 week FINDINGS: LUMBAR  SPINE: BONES: No acute fracture. No aggressive appearing osseous lesion. Alignment is normal. DISCS AND DEGENERATIVE CHANGES: No severe degenerative changes. SOFT TISSUES: No acute abnormality. IMPRESSION: 1. No acute abnormality of the lumbar spine. Electronically signed by: Ryan Salvage MD 07/30/2024 07:40 PM EST RP Workstation: [id number]      Medications Ordered in the ED  lidocaine  (LIDODERM ) 5 % 2 patch (2 patches Transdermal Patch Applied 07/30/24 1845)  ketorolac  (TORADOL ) 15 MG/ML injection 15 mg (15 mg Intramuscular Given 07/30/24 1845)  oxyCODONE  (Oxy IR/ROXICODONE ) immediate release tablet 5 mg (5 mg Oral Given 07/30/24 1845)  acetaminophen  (TYLENOL ) tablet 1,000 mg (1,000 mg Oral Given 07/30/24 1845)                                   Medical Decision Making Amount and/or Complexity of Data Reviewed Radiology: ordered.  Risk OTC drugs. Prescription drug management.   Patient is a 43 year old male who presents to the ED for increasing back pain for the past week.  Please see detailed HPI above.  On exam patient is alert distress.  Physical exam as noted above.  Neurovascular intact and ambulatory.  Differential includes acute lumbar strain, degenerative disc disease, sciatica, UTI, pyelonephritis, cauda equina.  Lab workup was obtained at urgent care a few days prior and was negative for kidney disease or UTI.  X-ray of the lumbar spine unremarkable today.  Suspect patient's pain is musculoskeletal in nature.  Patient was given Tylenol , Toradol , oxycodone , and lidocaine  patch while in ED and states his pain is much improved.  Otherwise vital signs stable and stable for discharge home.  Prescribed prednisone , naproxen , Flexeril , and a few Norco.  Symptomatic care discussed.  Advised PCP follow-up in 1 week if no improvement.  Return precautions provided.   Final diagnoses:  Strain of lumbar region, initial encounter    ED Discharge Orders          Ordered     naproxen  (NAPROSYN ) 500 MG tablet  2 times daily        07/30/24 2002    cyclobenzaprine  (FLEXERIL ) 10 MG tablet  2 times daily PRN        07/30/24 2002    HYDROcodone -acetaminophen  (NORCO/VICODIN) 5-325 MG tablet  Every 6 hours PRN        07/30/24 2002    predniSONE  (DELTASONE ) 20 MG tablet  Daily        07/30/24 2002               Neysa Thersia RAMAN, [place] 07/30/24 2049    Dreama Longs, MD 07/31/24 1226

## 2024-08-11 ENCOUNTER — Other Ambulatory Visit: Payer: Self-pay | Admitting: Physician Assistant

## 2024-08-11 DIAGNOSIS — I10 Essential (primary) hypertension: Secondary | ICD-10-CM
